# Patient Record
Sex: Male | Born: 1935 | Race: White | Hispanic: No | Marital: Married | Smoking: Former smoker
Health system: Southern US, Community
[De-identification: ages and names within clinical notes are randomized; demographics above are authoritative.]

## PROBLEM LIST (undated history)

## (undated) DIAGNOSIS — K529 Noninfective gastroenteritis and colitis, unspecified: Secondary | ICD-10-CM

## (undated) DIAGNOSIS — N210 Calculus in bladder: Secondary | ICD-10-CM

## (undated) DIAGNOSIS — D500 Iron deficiency anemia secondary to blood loss (chronic): Secondary | ICD-10-CM

## (undated) DIAGNOSIS — T4145XA Adverse effect of unspecified anesthetic, initial encounter: Secondary | ICD-10-CM

## (undated) DIAGNOSIS — K5000 Crohn's disease of small intestine without complications: Secondary | ICD-10-CM

## (undated) DIAGNOSIS — N400 Enlarged prostate without lower urinary tract symptoms: Secondary | ICD-10-CM

## (undated) DIAGNOSIS — N529 Male erectile dysfunction, unspecified: Secondary | ICD-10-CM

## (undated) DIAGNOSIS — N5201 Erectile dysfunction due to arterial insufficiency: Secondary | ICD-10-CM

## (undated) DIAGNOSIS — D333 Benign neoplasm of cranial nerves: Secondary | ICD-10-CM

## (undated) DIAGNOSIS — M1990 Unspecified osteoarthritis, unspecified site: Secondary | ICD-10-CM

## (undated) DIAGNOSIS — Z86718 Personal history of other venous thrombosis and embolism: Secondary | ICD-10-CM

## (undated) DIAGNOSIS — Q430 Meckel's diverticulum (displaced) (hypertrophic): Secondary | ICD-10-CM

## (undated) DIAGNOSIS — T8859XA Other complications of anesthesia, initial encounter: Secondary | ICD-10-CM

## (undated) DIAGNOSIS — Z8673 Personal history of transient ischemic attack (TIA), and cerebral infarction without residual deficits: Secondary | ICD-10-CM

## (undated) DIAGNOSIS — R972 Elevated prostate specific antigen [PSA]: Secondary | ICD-10-CM

## (undated) DIAGNOSIS — D3610 Benign neoplasm of peripheral nerves and autonomic nervous system, unspecified: Secondary | ICD-10-CM

## (undated) DIAGNOSIS — Z87448 Personal history of other diseases of urinary system: Secondary | ICD-10-CM

## (undated) DIAGNOSIS — M4800 Spinal stenosis, site unspecified: Secondary | ICD-10-CM

## (undated) DIAGNOSIS — E7800 Pure hypercholesterolemia, unspecified: Secondary | ICD-10-CM

## (undated) DIAGNOSIS — E291 Testicular hypofunction: Secondary | ICD-10-CM

## (undated) DIAGNOSIS — Z87898 Personal history of other specified conditions: Secondary | ICD-10-CM

## (undated) DIAGNOSIS — M797 Fibromyalgia: Secondary | ICD-10-CM

## (undated) DIAGNOSIS — R82992 Hyperoxaluria: Secondary | ICD-10-CM

## (undated) HISTORY — DX: Erectile dysfunction due to arterial insufficiency: N52.01

## (undated) HISTORY — DX: Iron deficiency anemia secondary to blood loss (chronic): D50.0

## (undated) HISTORY — DX: Testicular hypofunction: E29.1

## (undated) HISTORY — DX: Spinal stenosis, site unspecified: M48.00

## (undated) HISTORY — PX: TONSILLECTOMY AND ADENOIDECTOMY: SUR1326

## (undated) HISTORY — DX: Hyperoxaluria: R82.992

## (undated) HISTORY — PX: OTHER SURGICAL HISTORY: SHX169

## (undated) HISTORY — DX: Fibromyalgia: M79.7

## (undated) HISTORY — DX: Meckel's diverticulum (displaced) (hypertrophic): Q43.0

## (undated) HISTORY — DX: Pure hypercholesterolemia, unspecified: E78.00

---

## 2003-12-18 ENCOUNTER — Inpatient Hospital Stay (HOSPITAL_COMMUNITY): Admission: EM | Admit: 2003-12-18 | Discharge: 2003-12-20 | Payer: Self-pay | Admitting: Psychiatry

## 2004-05-02 ENCOUNTER — Inpatient Hospital Stay (HOSPITAL_COMMUNITY): Admission: EM | Admit: 2004-05-02 | Discharge: 2004-05-10 | Payer: Self-pay | Admitting: Emergency Medicine

## 2004-05-10 ENCOUNTER — Inpatient Hospital Stay (HOSPITAL_COMMUNITY)
Admission: RE | Admit: 2004-05-10 | Discharge: 2004-05-13 | Payer: Self-pay | Admitting: Physical Medicine & Rehabilitation

## 2004-05-11 HISTORY — PX: HIP FRACTURE SURGERY: SHX118

## 2004-05-13 ENCOUNTER — Inpatient Hospital Stay (HOSPITAL_COMMUNITY): Admission: AD | Admit: 2004-05-13 | Discharge: 2004-05-20 | Payer: Self-pay | Admitting: Internal Medicine

## 2004-05-20 ENCOUNTER — Inpatient Hospital Stay
Admission: RE | Admit: 2004-05-20 | Discharge: 2004-05-27 | Payer: Self-pay | Admitting: Physical Medicine & Rehabilitation

## 2004-11-28 ENCOUNTER — Inpatient Hospital Stay (HOSPITAL_COMMUNITY): Admission: EM | Admit: 2004-11-28 | Discharge: 2004-12-07 | Payer: Self-pay | Admitting: Emergency Medicine

## 2004-11-28 ENCOUNTER — Ambulatory Visit: Payer: Self-pay | Admitting: Physical Medicine & Rehabilitation

## 2004-12-07 ENCOUNTER — Inpatient Hospital Stay (HOSPITAL_COMMUNITY)
Admission: RE | Admit: 2004-12-07 | Discharge: 2004-12-27 | Payer: Self-pay | Admitting: Physical Medicine & Rehabilitation

## 2005-01-02 ENCOUNTER — Ambulatory Visit: Payer: Self-pay | Admitting: Psychiatry

## 2005-01-02 ENCOUNTER — Other Ambulatory Visit (HOSPITAL_COMMUNITY): Admission: RE | Admit: 2005-01-02 | Discharge: 2005-01-03 | Payer: Self-pay | Admitting: Psychiatry

## 2005-01-03 ENCOUNTER — Encounter
Admission: RE | Admit: 2005-01-03 | Discharge: 2005-04-03 | Payer: Self-pay | Admitting: Physical Medicine & Rehabilitation

## 2005-01-19 ENCOUNTER — Encounter
Admission: RE | Admit: 2005-01-19 | Discharge: 2005-04-19 | Payer: Self-pay | Admitting: Physical Medicine & Rehabilitation

## 2005-01-31 ENCOUNTER — Ambulatory Visit: Payer: Self-pay | Admitting: Physical Medicine & Rehabilitation

## 2005-03-24 ENCOUNTER — Ambulatory Visit: Payer: Self-pay | Admitting: Gastroenterology

## 2005-03-26 ENCOUNTER — Inpatient Hospital Stay (HOSPITAL_COMMUNITY): Admission: EM | Admit: 2005-03-26 | Discharge: 2005-03-30 | Payer: Self-pay | Admitting: Emergency Medicine

## 2005-03-27 ENCOUNTER — Ambulatory Visit: Payer: Self-pay | Admitting: Gastroenterology

## 2005-04-22 ENCOUNTER — Ambulatory Visit (HOSPITAL_COMMUNITY): Admission: RE | Admit: 2005-04-22 | Discharge: 2005-04-22 | Payer: Self-pay | Admitting: Neurology

## 2005-05-02 ENCOUNTER — Ambulatory Visit: Payer: Self-pay | Admitting: Gastroenterology

## 2005-05-25 ENCOUNTER — Ambulatory Visit: Payer: Self-pay | Admitting: Gastroenterology

## 2005-11-14 ENCOUNTER — Ambulatory Visit (HOSPITAL_COMMUNITY): Admission: RE | Admit: 2005-11-14 | Discharge: 2005-11-14 | Payer: Self-pay | Admitting: Neurology

## 2005-12-05 ENCOUNTER — Ambulatory Visit (HOSPITAL_COMMUNITY): Admission: RE | Admit: 2005-12-05 | Discharge: 2005-12-06 | Payer: Self-pay | Admitting: Urology

## 2006-03-02 ENCOUNTER — Ambulatory Visit (HOSPITAL_COMMUNITY): Admission: RE | Admit: 2006-03-02 | Discharge: 2006-03-02 | Payer: Self-pay | Admitting: Neurology

## 2006-10-29 ENCOUNTER — Encounter: Admission: RE | Admit: 2006-10-29 | Discharge: 2006-10-29 | Payer: Self-pay | Admitting: Neurology

## 2006-11-11 IMAGING — CR DG SMALL BOWEL
3 series · 3 of 3 positions shown · non-contrast
Comparison: none

CLINICAL DATA: Crohn's disease.  Resolving small bowel obstruction.
 SMALL BOWEL FOLLOW-THROUGH   - 03/28/05 AT 2712 HOURS:

[view not recorded (1 of 3)]
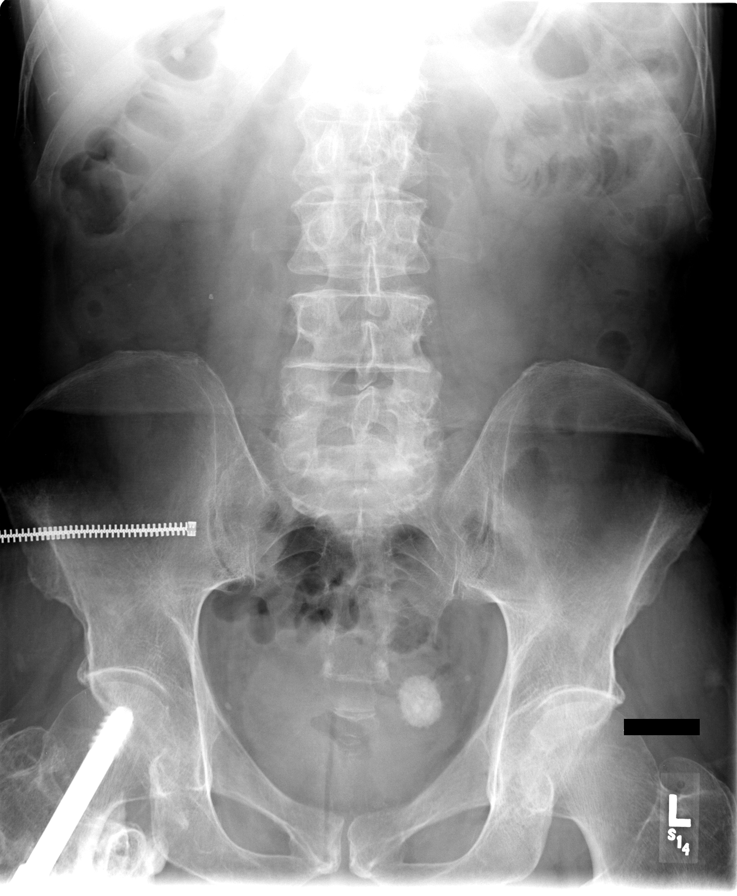

[view not recorded (2 of 3)]
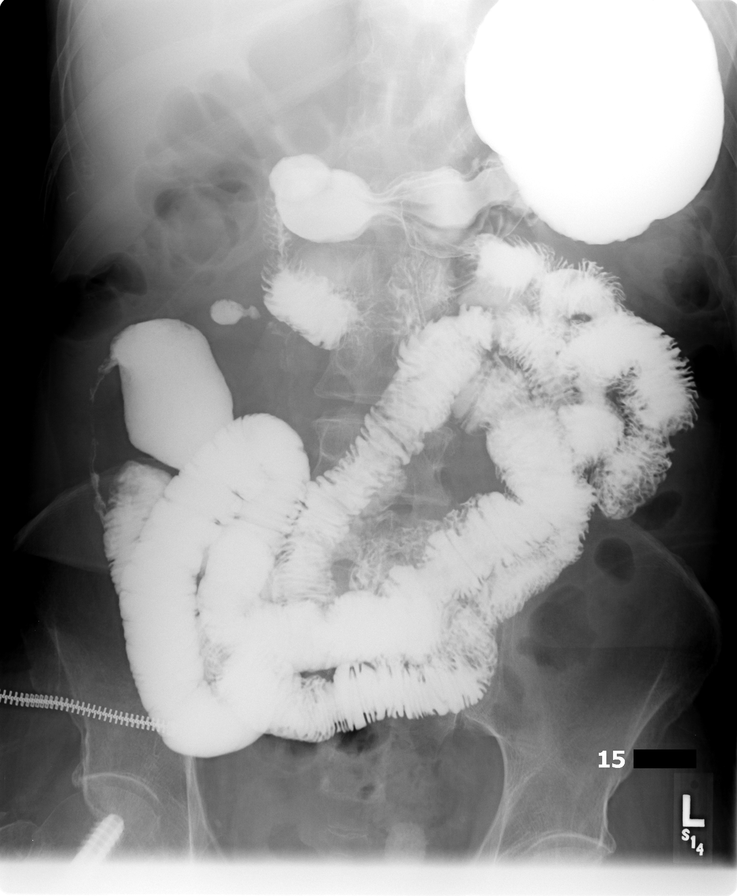

[view not recorded (3 of 3)]
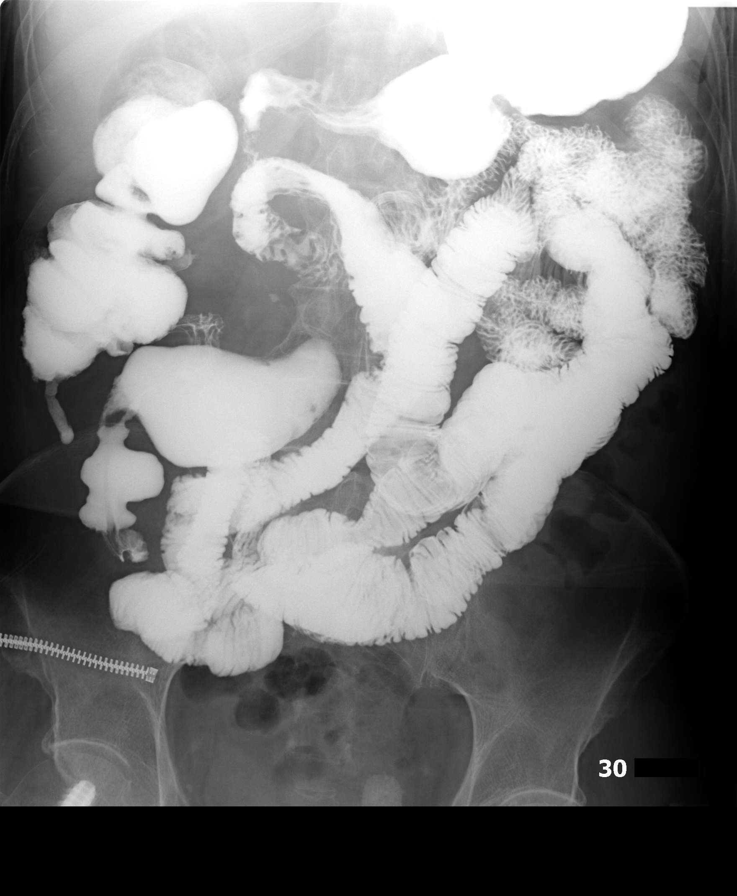

[3 of 3 positions shown; findings below may reference images not displayed]

FINDINGS: The scout KUB demonstrates a nonspecific mildly prominent small bowel loop in the left upper quadrant and a large calculus over the pelvis, likely a bladder calculus.
 Transit of barium through the small bowel is rapid as contrast reaches the colon at 30 minutes.  Imaging of the small bowel demonstrates two segments of diseased small bowel in the distal ileum.  The areas demonstrate severe narrowing characteristic of a ?string sign? over a length of 5-10 cm.  This occurs in two separate segments separated by a 10 cm length of more normal appearing ileum.  In addition, in the right lower quadrant involving a segment of ileum more proximal than the two mentioned diseased segments, there is a focal outpouching of contrast which fills a small cavity.  This does not communicate with the terminal ileum and either represents a diverticulum or focal extravasation of contrast which has walled off.  There is no disproportionate dilatation of small bowel to suggest obstruction.  The appendix is within normal limits.
IMPRESSION: 1.  Rapid transit of contrast to the colon without disproportionate small bowel dilatation.
 2.  Two areas of severe narrowing in the terminal ileum as described compatible with a ?string sign? as seen with Crohn's disease.
 3.  Focal outpouching of contrast from a segment of proximal ileum, either a diverticulum or contained bowel extravasation.  CT can be performed to further delineate pathology.

## 2015-10-18 ENCOUNTER — Other Ambulatory Visit: Payer: Self-pay

## 2015-10-18 ENCOUNTER — Telehealth: Payer: Self-pay | Admitting: Gastroenterology

## 2015-10-18 NOTE — Telephone Encounter (Signed)
Former patient of Dr Sharlett Iles who has not had problems with his Crohn's until recently. He had a sudden onset of abdominal pain and vomiting. May have had some fever.He went to [facility] where he was given IM Phenergan and prescription for oral prednisone. He was also told to follow up. He has some improvement. Scheduled for an appointment.

## 2015-10-20 ENCOUNTER — Encounter: Payer: Self-pay | Admitting: Physician Assistant

## 2015-10-20 ENCOUNTER — Other Ambulatory Visit (INDEPENDENT_AMBULATORY_CARE_PROVIDER_SITE_OTHER): Payer: Medicare Other

## 2015-10-20 ENCOUNTER — Ambulatory Visit (INDEPENDENT_AMBULATORY_CARE_PROVIDER_SITE_OTHER): Payer: Medicare Other | Admitting: Physician Assistant

## 2015-10-20 ENCOUNTER — Telehealth: Payer: Self-pay | Admitting: Physician Assistant

## 2015-10-20 VITALS — BP 130/70 | HR 104 | Ht 71.0 in | Wt 166.4 lb

## 2015-10-20 DIAGNOSIS — R109 Unspecified abdominal pain: Secondary | ICD-10-CM

## 2015-10-20 DIAGNOSIS — Q430 Meckel's diverticulum (displaced) (hypertrophic): Secondary | ICD-10-CM | POA: Insufficient documentation

## 2015-10-20 DIAGNOSIS — E559 Vitamin D deficiency, unspecified: Secondary | ICD-10-CM

## 2015-10-20 DIAGNOSIS — R197 Diarrhea, unspecified: Secondary | ICD-10-CM

## 2015-10-20 DIAGNOSIS — Z8719 Personal history of other diseases of the digestive system: Secondary | ICD-10-CM

## 2015-10-20 DIAGNOSIS — R112 Nausea with vomiting, unspecified: Secondary | ICD-10-CM

## 2015-10-20 LAB — COMPREHENSIVE METABOLIC PANEL
ALT: 21 U/L (ref 0–53)
AST: 13 U/L (ref 0–37)
Albumin: 3.9 g/dL (ref 3.5–5.2)
Alkaline Phosphatase: 57 U/L (ref 39–117)
BUN: 18 mg/dL (ref 6–23)
CHLORIDE: 105 meq/L (ref 96–112)
CO2: 27 mEq/L (ref 19–32)
Calcium: 10.3 mg/dL (ref 8.4–10.5)
Creatinine, Ser: 1 mg/dL (ref 0.40–1.50)
GFR: 76.79 mL/min (ref >60.00)
GLUCOSE: 105 mg/dL — AB (ref 70–99)
POTASSIUM: 4 meq/L (ref 3.5–5.1)
SODIUM: 141 meq/L (ref 135–145)
Total Bilirubin: 0.5 mg/dL (ref 0.2–1.2)
Total Protein: 7.1 g/dL (ref 6.0–8.3)

## 2015-10-20 LAB — LIPASE: Lipase: 68 U/L — ABNORMAL HIGH (ref 11.0–59.0)

## 2015-10-20 LAB — CBC WITH DIFFERENTIAL/PLATELET
Basophils Absolute: 0 10*3/uL (ref 0.0–0.1)
Basophils Relative: 0.2 % (ref 0.0–3.0)
EOS ABS: 0.1 10*3/uL (ref 0.0–0.7)
EOS PCT: 0.6 % (ref 0.0–5.0)
HCT: 37.7 % — ABNORMAL LOW (ref 39.0–52.0)
Hemoglobin: 12.5 g/dL — ABNORMAL LOW (ref 13.0–17.0)
LYMPHS ABS: 1.2 10*3/uL (ref 0.7–4.0)
Lymphocytes Relative: 11.3 % — ABNORMAL LOW (ref 12.0–46.0)
MCHC: 33.1 g/dL (ref 30.0–36.0)
MCV: 92.1 fl (ref 78.0–100.0)
MONO ABS: 0.7 10*3/uL (ref 0.1–1.0)
Monocytes Relative: 6.1 % (ref 3.0–12.0)
NEUTROS PCT: 81.8 % — AB (ref 43.0–77.0)
Neutro Abs: 9 10*3/uL — ABNORMAL HIGH (ref 1.4–7.7)
Platelets: 430 10*3/uL — ABNORMAL HIGH (ref 150.0–400.0)
RBC: 4.09 Mil/uL — AB (ref 4.22–5.81)
RDW: 13.7 % (ref 11.5–15.5)
WBC: 11 10*3/uL — ABNORMAL HIGH (ref 4.0–10.5)

## 2015-10-20 LAB — CALCIUM: Calcium: 10.4 mg/dL (ref 8.4–10.5)

## 2015-10-20 LAB — VITAMIN D 25 HYDROXY (VIT D DEFICIENCY, FRACTURES): VITD: 29.68 ng/mL — ABNORMAL LOW (ref 30.00–100.00)

## 2015-10-20 LAB — AMYLASE: Amylase: 52 U/L (ref 27–131)

## 2015-10-20 LAB — C-REACTIVE PROTEIN: CRP: 0.4 mg/dL — ABNORMAL LOW (ref 0.5–20.0)

## 2015-10-20 NOTE — Progress Notes (Signed)
Patient ID: Raymond Rubio, male   DOB: 1935/11/05, 80 y.o.   MRN: [id number]    HPI:  Raymond Rubio is a 80 y.o.   male  referred by Franchot Gallo, MD for evaluation of nausea, vomiting, and diarrhea. Raymond Rubio was previously a patient of Dr. Verl Blalock, but has not been seen in the office since 2006. Raymond Rubio reports that he was diagnosed with Crohn's disease by Dr. Coralie Common in [place], [place], in 1972. In the 1990s he had multiple ER visits and became Dr. Buel Ream patient. He states he was never on any long-term maintenance medications. He had numerous courses of prednisone and states before his last visit he was given numerous samples of Entocort to use on a when necessary basis. Since then, he has managed his "flares" with Entocort that he had left at home. He states that for a period of time in the early 2000 he was on long-term tetracycline and a probiotic. He has also been on Flagyl several times. He reports that when he has flares he gets frequent diarrhea. He states in the past he had a barium enema that was normal. He was hospitalized in 1972 with diarrhea and had an upper GI that showed a "string sign" and he was told he had Crohn's. He has never had an upper endoscopy or a colonoscopy.  Through the past years, he has had days of formed stools alternating with days of loose stools. He notes that ingestion of fatty foods causes immediate postprandial diarrhea. He often eats, gets crampy, and has to run to the bathroom. He has had no bright red blood per rectum or melena. About 10 or 11 days ago he began to feel nauseous and has vomited 5-10 times daily for several days. He was seen at the [facility] urgent care in [facility] and had blood work that was normal. No imaging was done. He states he was given 3 tablets of prednisone and told to follow up with GI. He continues to have waves of nausea are worse on an empty stomach. He has no vomiting. He states he feels like  he has to vomit but can't always bring anything up. He has been able to keep liquids down and small amounts of blood or cereal. His bowel movements have been liquidy 3-4 times per day. He has no mucus. He has not had nocturnal stooling. He has had no bright red blood per rectum or melena. His weight has been stable.  He also reports that he has been treated periodically in the past for bacterial overgrowth with Xifaxan and Flora Q by Dr. Sharlett Iles. He says he has had vitamin D deficiency and calcium deficiency from chronic steroid use but has not seen a primary care doctor in many years. 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 He was on warfarin and antiseizure medications per. Of time and the thrombosis resolves. 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 He also has a history of kidney stones and BPH. He had a right hip pinning in 2005. He denies a family history of colon cancer, colon polyps, or inflammatory bowel disease. He states his maternal grandfather had diverticular disease. He quit smoking in his early 19s and denies use of alcohol or intravenous drugs. He is requesting the name of the primary care provider as he would like to establish care with a primary care provider as well.   Past Medical History  Diagnosis  Date  . Crohn's disease ([place])   . Fibromyalgia   . Thrombosis, superior sagittal sinus   . Zenker's diverticulum     Past Surgical History  Procedure Laterality Date  . Hip fracture surgery Right     with pin  . Bladder stone surgery     Family History  Problem Relation Age of Onset  . Esophageal cancer Neg Hx   . Colon cancer Neg Hx   . Diverticulitis Brother   . Pancreatic cancer Neg Hx   . Stomach cancer Neg Hx   . Diabetes Neg Hx   . Kidney disease Neg Hx   . Liver disease Neg Hx    Social History  Substance Use Topics  . Smoking status: Former Research scientist (life sciences)  . Smokeless tobacco: None     Comment: stopped in his  72s  . Alcohol Use: No   Current Outpatient Prescriptions  Medication Sig Dispense Refill  . alfuzosin (UROXATRAL) 10 MG 24 hr tablet Take 10 mg by mouth daily with breakfast.    . aspirin 81 MG tablet Take 81 mg by mouth daily.    Marland Kitchen CALCIUM CARBONATE PO Take 1-2 capsules by mouth daily.    . Cholecalciferol (VITAMIN D3) 10000 UNITS TABS Take 1 tablet by mouth once a week.    . Cholecalciferol (VITAMIN D3) 5000 UNITS CAPS Take 1 capsule by mouth daily.    Marland Kitchen dutasteride (AVODART) 0.5 MG capsule Take 0.5 mg by mouth daily.    Marland Kitchen loperamide (IMODIUM) 2 MG capsule Take 1 mg by mouth as needed for diarrhea or loose stools.    . Multiple Vitamins-Minerals (CENTRUM ADULTS PO) Take by mouth.     No current facility-administered medications for this visit.   No Known Allergies   Review of Systems: Gen: Denies any fever, chills, sweats, anorexia, fatigue, weakness, malaise, weight loss, and sleep disorder CV: Denies chest pain, angina, palpitations, syncope, orthopnea, PND, peripheral edema, and claudication. Resp: Denies dyspnea at rest, dyspnea with exercise, cough, sputum, wheezing, coughing up blood, and pleurisy. GI: Denies vomiting blood, jaundice, and fecal incontinence.   Denies dysphagia or odynophagia. GU : Denies urinary burning, blood in urine, urinary frequency, urinary hesitancy, nocturnal urination, and urinary incontinence. MS: Denies joint pain, limitation of movement, and swelling, stiffness, low back pain, extremity pain. Denies muscle weakness, cramps, atrophy.  Derm: Denies rash, itching, dry skin, hives, moles, warts, or unhealing ulcers.  Psych: Denies depression, anxiety, memory loss, suicidal ideation, hallucinations, paranoia, and confusion. Heme: Denies bruising, bleeding, and enlarged lymph nodes. Neuro:  Denies any headaches, dizziness, paresthesias. Endo:  Denies any problems with DM, thyroid, adrenal function  Labs: Blood work from [facility] 10/11/2015 CBC  white count 10.2 hemoglobin 14.1, hematocrit 44.3, platelets 459,000, MCV 97. Sedimentation rate 29. Comprehensive metabolic panel total bili 1.4, alkaline phosphatase 64, AST 16, ALT 16. BUN 22, creatinine 1.3. Amylase 33, lipase 29.   Physical Exam: BP 130/70 mmHg  Pulse 104  Ht 5' 11"  (1.803 m)  Wt 166 lb 6.4 oz (75.479 kg)  BMI 23.22 kg/m2 Constitutional: Pleasant,well-developed, pale, Caucasian male in no acute distress. HEENT: Normocephalic and atraumatic. Conjunctivae are normal. No scleral icterus. Neck supple. No thyromegaly Cardiovascular: Normal rate, regular rhythm.  Pulmonary/chest: Effort normal and breath sounds normal. No wheezing, rales or rhonchi. Abdominal: Soft, nondistended, nontender. Bowel sounds active throughout. There are no masses palpable. No hepatomegaly. Extremities: no edema Lymphadenopathy: No cervical adenopathy noted. Neurological: Alert and oriented to person place and time. Skin: Skin  is warm and dry. No rashes noted. Psychiatric: Normal mood and affect. Behavior is normal.  ASSESSMENT AND PLAN: 80 year old male with a history of what sounds like chronic small bowel Crohn's, IBS, recurrent bacterial overgrowth syndrome, chronic calcium deficiency, and vitamin D deficiency, referred for evaluation. Patient has been experiencing nausea vomiting and diarrhea for the past 10 days. A CBC, comprehensive metabolic panel, amylase, lipase, CRP, calcium, and vitamin D will be obtained. A stool for C. difficile will also be obtained. He will be scheduled for a CT of the abdomen and pelvis evaluate for any disease activity, colitis, ileitis, etc. He will be given a trial of Zofran 8 mg 1 by mouth every 8 hours when necessary nausea. He will follow up in 2 weeks, sooner if needed. He will likely need to be scheduled for a colonoscopy for further evaluation of disease activity as well, pending the results of his CT scan. He has also been provided with the names of several  primary care providers in [place] primary care so that he can establish care with a primary care provider.    Nuha Degner, Vita Barley PA-C 10/20/2015, 3:24 PM  CC: Franchot Gallo, MD

## 2015-10-20 NOTE — Patient Instructions (Addendum)
You have been scheduled for a CT scan of the abdomen and pelvis at [facility] ([address]---this is in the same building as [facility]).   You are scheduled on 10/21/2015 at 1:30pm. You should arrive 15 minutes prior to your appointment time for registration. Please follow the written instructions below on the day of your exam:  WARNING: IF YOU ARE ALLERGIC TO IODINE/X-RAY DYE, PLEASE NOTIFY RADIOLOGY IMMEDIATELY AT [phone]! YOU WILL BE GIVEN A 13 HOUR PREMEDICATION PREP.  1) Do not eat or drink anything after 9:30am (4 hours prior to your test) 2) You have been given 2 bottles of oral contrast to drink. The solution may taste better if refrigerated, but do NOT add ice or any other liquid to this solution. Shake well before drinking.    Drink 1 bottle of contrast @ 11:30am (2 hours prior to your exam)  Drink 1 bottle of contrast @ 12:30pm (1 hour prior to your exam)  You may take any medications as prescribed with a small amount of water except for the following: Metformin, Glucophage, Glucovance, Avandamet, Riomet, Fortamet, Actoplus Met, Janumet, Glumetza or Metaglip. The above medications must be held the day of the exam AND 48 hours after the exam.  The purpose of you drinking the oral contrast is to aid in the visualization of your intestinal tract. The contrast solution may cause some diarrhea. Before your exam is started, you will be given a small amount of fluid to drink. Depending on your individual set of symptoms, you may also receive an intravenous injection of x-ray contrast/dye. Plan on being at [facility] for 30 minutes or long, depending on the type of exam you are having performed.  This test typically takes 30-45 minutes to complete.  If you have any questions regarding your exam or if you need to reschedule, you may call the CT department at [phone] between the hours of 8:00 am and 5:00 pm,  Monday-Friday.  ________________________________________________________________________   

## 2015-10-21 ENCOUNTER — Encounter (HOSPITAL_COMMUNITY): Payer: Self-pay

## 2015-10-21 ENCOUNTER — Ambulatory Visit (HOSPITAL_COMMUNITY)
Admission: RE | Admit: 2015-10-21 | Discharge: 2015-10-21 | Disposition: A | Discharge status: Home / Self Care | Payer: Medicare Other | Source: Ambulatory Visit | Attending: Physician Assistant | Admitting: Physician Assistant

## 2015-10-21 DIAGNOSIS — I70202 Unspecified atherosclerosis of native arteries of extremities, left leg: Secondary | ICD-10-CM | POA: Diagnosis not present

## 2015-10-21 DIAGNOSIS — R112 Nausea with vomiting, unspecified: Secondary | ICD-10-CM | POA: Insufficient documentation

## 2015-10-21 DIAGNOSIS — K5790 Diverticulosis of intestine, part unspecified, without perforation or abscess without bleeding: Secondary | ICD-10-CM | POA: Insufficient documentation

## 2015-10-21 DIAGNOSIS — N400 Enlarged prostate without lower urinary tract symptoms: Secondary | ICD-10-CM | POA: Insufficient documentation

## 2015-10-21 DIAGNOSIS — R197 Diarrhea, unspecified: Secondary | ICD-10-CM | POA: Diagnosis not present

## 2015-10-21 DIAGNOSIS — Z87891 Personal history of nicotine dependence: Secondary | ICD-10-CM | POA: Diagnosis not present

## 2015-10-21 DIAGNOSIS — N281 Cyst of kidney, acquired: Secondary | ICD-10-CM | POA: Diagnosis not present

## 2015-10-21 DIAGNOSIS — R109 Unspecified abdominal pain: Secondary | ICD-10-CM | POA: Insufficient documentation

## 2015-10-21 DIAGNOSIS — K5090 Crohn's disease, unspecified, without complications: Secondary | ICD-10-CM | POA: Insufficient documentation

## 2015-10-21 DIAGNOSIS — N210 Calculus in bladder: Secondary | ICD-10-CM | POA: Insufficient documentation

## 2015-10-21 DIAGNOSIS — Z8719 Personal history of other diseases of the digestive system: Secondary | ICD-10-CM

## 2015-10-21 DIAGNOSIS — K4020 Bilateral inguinal hernia, without obstruction or gangrene, not specified as recurrent: Secondary | ICD-10-CM | POA: Insufficient documentation

## 2015-10-21 DIAGNOSIS — I723 Aneurysm of iliac artery: Secondary | ICD-10-CM | POA: Insufficient documentation

## 2015-10-21 MED ORDER — IOHEXOL 300 MG/ML  SOLN
100.0000 mL | Freq: Once | INTRAMUSCULAR | Status: AC | PRN
  Administered 2015-10-21: 100 mL via INTRAVENOUS

## 2015-10-21 MED ORDER — ONDANSETRON HCL 8 MG PO TABS: 8.0000 mg | ORAL_TABLET | Freq: Three times a day (TID) | ORAL | Status: DC | PRN

## 2015-10-21 NOTE — Telephone Encounter (Signed)
Rx sent to pharmacy. Patient will try his best to drink contrast. He will bring in stool sample when he obtains it.

## 2015-10-21 NOTE — Telephone Encounter (Signed)
Patient calling back regarding this. He isn't sure if he can keep contrast down for Imaging today. He states that he didn't have anymore zofran to take before this procedure. Best # [phone]

## 2015-10-21 NOTE — Addendum Note (Signed)
Addended by: Hulan Saas on: 10/21/2015 09:22 AM   Modules accepted: Orders

## 2015-10-25 ENCOUNTER — Telehealth: Payer: Self-pay | Admitting: Physician Assistant

## 2015-10-25 NOTE — Telephone Encounter (Signed)
Waiting for reply from Dr Carlean Purl

## 2015-10-25 NOTE — Telephone Encounter (Signed)
Patient notified

## 2015-10-25 NOTE — Telephone Encounter (Signed)
Patient calling on CT results, Please advise

## 2015-10-27 ENCOUNTER — Telehealth: Payer: Self-pay | Admitting: Physician Assistant

## 2015-10-27 ENCOUNTER — Other Ambulatory Visit: Payer: Medicare Other

## 2015-10-27 ENCOUNTER — Other Ambulatory Visit: Payer: Self-pay | Admitting: *Deleted

## 2015-10-27 DIAGNOSIS — Z8719 Personal history of other diseases of the digestive system: Secondary | ICD-10-CM

## 2015-10-27 DIAGNOSIS — R109 Unspecified abdominal pain: Secondary | ICD-10-CM

## 2015-10-27 DIAGNOSIS — R197 Diarrhea, unspecified: Secondary | ICD-10-CM

## 2015-10-27 DIAGNOSIS — R112 Nausea with vomiting, unspecified: Secondary | ICD-10-CM

## 2015-10-27 NOTE — Progress Notes (Signed)
Quick Note:  Go ahead w/ quantiferon and hep B testing Send him the [place] sheet please - I can get it for you if you need help but if you Google "[facility] biologics PDF" it wil show up and can be printed He comes 11/9 to you and i am in clinic and we can talk then Not sure about colonoscopy - thinking ______

## 2015-10-27 NOTE — Telephone Encounter (Signed)
Left a message for patient to call back. 

## 2015-10-28 LAB — HEPATITIS B SURFACE ANTIGEN: Hepatitis B Surface Ag: NEGATIVE

## 2015-10-28 LAB — HEPATITIS B SURFACE ANTIBODY, QUANTITATIVE: Hepatitis B-Post: 0 m[IU]/mL

## 2015-10-28 LAB — HEPATITIS A ANTIBODY, TOTAL: Hep A Total Ab: NONREACTIVE

## 2015-10-28 LAB — HEPATITIS C ANTIBODY: HCV Ab: NEGATIVE

## 2015-10-28 NOTE — Telephone Encounter (Signed)
Left a message for patient to call back. 

## 2015-10-28 NOTE — Progress Notes (Signed)
Quick Note:  OK re does not want biologics Prednisone 10 mg tabs # 100 1 RF  Take 4 daily x 1 week, 3 daily x 2 weeks then 2 daily Stay at 2 See me in jan please - sooner prn ______

## 2015-10-29 ENCOUNTER — Other Ambulatory Visit: Payer: Self-pay | Admitting: *Deleted

## 2015-10-29 LAB — QUANTIFERON TB GOLD ASSAY (BLOOD)
Interferon Gamma Release Assay: NEGATIVE
MITOGEN VALUE: 5.7 [IU]/mL
QUANTIFERON NIL VALUE: 0.02 [IU]/mL
Quantiferon Tb Ag Minus Nil Value: 0.01 IU/mL
TB Ag value: 0.03 IU/mL

## 2015-10-29 MED ORDER — PREDNISONE 10 MG PO TABS: ORAL_TABLET | ORAL | Status: DC

## 2015-10-29 NOTE — Telephone Encounter (Signed)
Unable to reach patient at given number. Goes to another number.

## 2015-10-29 NOTE — Progress Notes (Signed)
Agree w/ Ms. Hvozdovic's note and mangement.

## 2015-10-29 NOTE — Telephone Encounter (Signed)
See results note. 

## 2015-11-03 ENCOUNTER — Other Ambulatory Visit (INDEPENDENT_AMBULATORY_CARE_PROVIDER_SITE_OTHER): Payer: Medicare Other

## 2015-11-03 ENCOUNTER — Ambulatory Visit (INDEPENDENT_AMBULATORY_CARE_PROVIDER_SITE_OTHER): Payer: Medicare Other | Admitting: Physician Assistant

## 2015-11-03 ENCOUNTER — Encounter: Payer: Self-pay | Admitting: Physician Assistant

## 2015-11-03 VITALS — BP 130/80 | HR 105 | Ht 71.0 in | Wt 168.8 lb

## 2015-11-03 DIAGNOSIS — K219 Gastro-esophageal reflux disease without esophagitis: Secondary | ICD-10-CM

## 2015-11-03 DIAGNOSIS — K50919 Crohn's disease, unspecified, with unspecified complications: Secondary | ICD-10-CM | POA: Diagnosis not present

## 2015-11-03 LAB — FOLATE

## 2015-11-03 LAB — IBC PANEL
IRON: 106 ug/dL (ref 42–165)
Saturation Ratios: 26 % (ref 20.0–50.0)
TRANSFERRIN: 291 mg/dL (ref 212.0–360.0)

## 2015-11-03 LAB — VITAMIN B12: Vitamin B-12: 326 pg/mL (ref 211–911)

## 2015-11-03 LAB — FERRITIN: Ferritin: 10.6 ng/mL — ABNORMAL LOW (ref 22.0–322.0)

## 2015-11-03 NOTE — Progress Notes (Signed)
Patient ID: Raymond Rubio, male   DOB: 12-Nov-1935, 80 y.o.   MRN: [id number]     History of Present Illness: Raymond Rubio is a 80 year old gentleman who was initially evaluated here on October 26. He was previously a patient of Dr. Verl Blalock. He was diagnosed with Crohn's disease in 1972. In the 1990s he became Dr. Buel Ream patient. He has never been on any long-term medications for his Crohn's but has had innumerable courses of prednisone. He has never had an upper endoscopy or colonoscopy. At his last visit he was sent for a CT scan which revealed bowel wall thickening of the distal ileum to the ileocecal valve consistent with a history of Crohn's disease. There was also additional questionable segment of bowel wall thickening of the small bowel loop in the right mid abdomen. He was prescribed prednisone but never filled the prescription because he states he feels fine and doesn't want to be on any medications. He had hepatitis serologies and a QuantiFERON TB cold drawn in anticipation of initiation of possible biologic therapy. He states he is not interested in being on any medication since she feels fine. He does however report that he is frequently nauseous on an empty stomach. His nausea is alleviated with ingestion of food. He has been belching or burping a lot. He has no nausea vomiting or dysphagia. He has not been using any medications for these symptoms.   Past Medical History  Diagnosis Date  . Crohn's disease ([place])   . Fibromyalgia   . Thrombosis, superior sagittal sinus   . Meckel's diverticulum     Past Surgical History  Procedure Laterality Date  . Hip fracture surgery Right     with pin  . Bladder stone surgery     Family History  Problem Relation Age of Onset  . Esophageal cancer Neg Hx   . Colon cancer Neg Hx   . Diverticulitis Brother   . Pancreatic cancer Neg Hx   . Stomach cancer Neg Hx   . Diabetes Neg Hx   . Kidney disease Neg Hx   . Liver  disease Neg Hx    Social History  Substance Use Topics  . Smoking status: Former Research scientist (life sciences)  . Smokeless tobacco: None     Comment: stopped in his 9s  . Alcohol Use: No   Current Outpatient Prescriptions  Medication Sig Dispense Refill  . alfuzosin (UROXATRAL) 10 MG 24 hr tablet Take 10 mg by mouth daily with breakfast.    . aspirin 81 MG tablet Take 81 mg by mouth daily.    Marland Kitchen CALCIUM CARBONATE PO Take 1-2 capsules by mouth daily.    . Cholecalciferol (VITAMIN D3) 10000 UNITS TABS Take 1 tablet by mouth once a week.    . Cholecalciferol (VITAMIN D3) 5000 UNITS CAPS Take 1 capsule by mouth daily.    Marland Kitchen dutasteride (AVODART) 0.5 MG capsule Take 0.5 mg by mouth daily.    Marland Kitchen loperamide (IMODIUM) 2 MG capsule Take 1 mg by mouth as needed for diarrhea or loose stools.    . Multiple Vitamins-Minerals (CENTRUM ADULTS PO) Take by mouth.    . ondansetron (ZOFRAN) 8 MG tablet Take 1 tablet (8 mg total) by mouth every 8 (eight) hours as needed for nausea or vomiting. 20 tablet 0  . predniSONE (DELTASONE) 10 MG tablet Take 40 mg daily x 1 week then 30 mg daily x 2 weeks then 20 mg daily until OV. 100 tablet 1   No current  facility-administered medications for this visit.   No Known Allergies   Review of Systems: Gen: Denies any fever, chills, sweats, anorexia, fatigue, weakness, malaise, weight loss, and sleep disorder CV: Denies chest pain, angina, palpitations, syncope, orthopnea, PND, peripheral edema, and claudication. Resp: Denies dyspnea at rest, dyspnea with exercise, cough, sputum, wheezing, coughing up blood, and pleurisy. GI: Denies vomiting blood, jaundice, and fecal incontinence.   Denies dysphagia or odynophagia. GU : Denies urinary burning, blood in urine, urinary frequency, urinary hesitancy, nocturnal urination, and urinary incontinence. MS: Denies joint pain, limitation of movement, and swelling, stiffness, low back pain, extremity pain. Denies muscle weakness, cramps, atrophy.    Derm: Denies rash, itching, dry skin, hives, moles, warts, or unhealing ulcers.  Psych: Denies depression, anxiety, memory loss, suicidal ideation, hallucinations, paranoia, and confusion. Heme: Denies bruising, bleeding, and enlarged lymph nodes. Neuro:  Denies any headaches, dizziness, paresthesia Endo:  Denies any problems with DM, thyroid, adrenal  LAB RESULTS: CBC 10/20/2015 WBC 11, hemoglobin 12.5, hematocrit 37.7, platelets 430,000, MCV 92.1. QuantiFERON TB cold negative Hepatitis A total antibody nonreactive hepatitis B surface antigen negative hepatitis B surface antibody negative hepatitis C antibody negative Studies:   Ct Abdomen Pelvis W Contrast  10/21/2015  CLINICAL DATA:  Diarrhea and abdominal pain for 10 days, nausea and vomiting, Crohn's disease, former smoker EXAM: CT ABDOMEN AND PELVIS WITH CONTRAST TECHNIQUE: Multidetector CT imaging of the abdomen and pelvis was performed using the standard protocol following bolus administration of intravenous contrast. Sagittal and coronal MPR images reconstructed from axial data set. CONTRAST:  147m OMNIPAQUE IOHEXOL 300 MG/ML SOLN IV. Dilute oral contrast. COMPARISON:  None FINDINGS: Lung bases clear. Minimal fatty infiltration of liver. Tiny LEFT renal cyst. Liver, gallbladder, spleen, pancreas, kidneys, and adrenal glands normal. 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 Bowel wall thickening of distal ileum to ileocecal valve consistent with history of Crohn's disease. No evidence of bowel obstruction. Additional questionable segment of bowel wall thickening of a small bowel loop in the RIGHT mid abdomen. Normal appendix. Diverticulosis of distal transverse through sigmoid colon without evidence of diverticulitis. Stomach and bowel loops otherwise normal appearance. 11 mm calculus within urinary bladder. Significant prostatic enlargement 5.8 x 5.5 x 5.5 cm.  BILATERAL inguinal hernias containing fat. No mass, adenopathy, free fluid or free air. Osseous demineralization with scattered degenerative disc disease changes lumbar spine. Posttraumatic deformity of RIGHT femur with evidence of prior nailing/ORIF. IMPRESSION: Distal ileal bowel wall thickening consistent with history of Crohn's disease. Question additional segmental wall thickening in anterior RIGHT mid abdomen. No evidence of perforation or abscess. Marked prostatic enlargement. 11 mm bladder calculus. BILATERAL inguinal hernias containing fat. 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 These results will be called to the ordering clinician or representative by the Radiologist Assistant, and communication documented in the PACS or zVision Dashboard. Electronically Signed   By: MLavonia DanaM.D.   On: 10/21/2015 14:36     Physical Exam: BP 130/80 mmHg  Pulse 105  Ht 5' 11"  (1.803 m)  Wt 168 lb 12.8 oz (76.567 kg)  BMI 23.55 kg/m2 General: Pleasant, well developed , male in no acute distress Head: Normocephalic and atraumatic Eyes:  sclerae anicteric, conjunctiva pink  Ears: Normal auditory acuity Lungs: Clear throughout to auscultation Heart: Regular rate and rhythm Abdomen: Soft, non distended, non-tender. No masses, no hepatomegaly. Normal bowel sounds Musculoskeletal: Symmetrical with no gross deformities  Extremities: No  edema  Neurological: Alert oriented x 4, grossly nonfocal Psychological:  Alert and cooperative. Normal mood and affect  Assessment and Recommendations:  80 year old male with a history of chronic small bowel Crohn's and IBS status post recent CT consistent with Crohn's, here for follow-up. Patient is currently asymptomatic and does not wish to have any medications for his Crohn's. He requests that iron studies and a B-12 be drawn despite his normal CBC. With regards to his nausea and belching, it was explained to him that this may be due to  some GERD. He does not want to take PPI due to risks of exacerbating his osteopenia. He will use Pepcid 20 mg twice a day. He will return in January at which time he would like to establish care with Dr. Carlean Purl.       Jalayia Bagheri, Vita Barley PA-C 11/03/2015,

## 2015-11-03 NOTE — Patient Instructions (Signed)
Use you Pepcid 20 mg twice a day as needed.   Follow up with Dr. Carlean Purl  Your physician has requested that you go to the basement for lab work before leaving today.

## 2015-11-04 ENCOUNTER — Other Ambulatory Visit: Payer: Self-pay | Admitting: *Deleted

## 2015-11-04 DIAGNOSIS — R790 Abnormal level of blood mineral: Secondary | ICD-10-CM

## 2015-11-04 MED ORDER — FERROUS GLUCONATE 324 (38 FE) MG PO TABS: 324.0000 mg | ORAL_TABLET | Freq: Every day | ORAL | Status: DC

## 2015-11-20 NOTE — Progress Notes (Signed)
Agree w/ Ms. Hvozdovic's note and mangement.

## 2015-12-31 ENCOUNTER — Encounter: Payer: Self-pay | Admitting: Internal Medicine

## 2015-12-31 ENCOUNTER — Ambulatory Visit (INDEPENDENT_AMBULATORY_CARE_PROVIDER_SITE_OTHER): Payer: Medicare Other | Admitting: Internal Medicine

## 2015-12-31 VITALS — BP 108/78 | HR 60 | Ht 71.0 in | Wt 169.0 lb

## 2015-12-31 DIAGNOSIS — D509 Iron deficiency anemia, unspecified: Secondary | ICD-10-CM

## 2015-12-31 DIAGNOSIS — D500 Iron deficiency anemia secondary to blood loss (chronic): Secondary | ICD-10-CM | POA: Insufficient documentation

## 2015-12-31 DIAGNOSIS — K50019 Crohn's disease of small intestine with unspecified complications: Secondary | ICD-10-CM

## 2015-12-31 DIAGNOSIS — K5000 Crohn's disease of small intestine without complications: Secondary | ICD-10-CM | POA: Insufficient documentation

## 2015-12-31 HISTORY — DX: Iron deficiency anemia secondary to blood loss (chronic): D50.0

## 2015-12-31 MED ORDER — BUDESONIDE 3 MG PO CPEP: 9.0000 mg | ORAL_CAPSULE | ORAL | Status: DC

## 2015-12-31 MED ORDER — FERROUS SULFATE 324 (65 FE) MG PO TBEC: 324.0000 mg | DELAYED_RELEASE_TABLET | Freq: Two times a day (BID) | ORAL | Status: DC

## 2015-12-31 NOTE — Patient Instructions (Signed)
   We have sent the following medications to your pharmacy for you to pick up at your convenience: Ferrous Sulfate and Entocort to have on hand for flares.   Please come in early March and get a CBC drawn.  No appointment needed, the lab is open 7:30AM-5:30PM, closed for lunch 1:30pm-2:00pm.   Today we are giving you information to read on immunomodulators and biologics.   I appreciate the opportunity to care for you. Silvano Rusk, MD, [facility]

## 2015-12-31 NOTE — Progress Notes (Signed)
Subjective:    Patient ID: Raymond Rubio, male    DOB: [birth year], 81 y.o.   MRN: [id number] Chief complaint: Follow-up Crohn's disease HPI Patient is an elderly white man with a long history of Crohn's disease of the small bowel. He was seen by one of our physician assistant's in November and refer to that note for other details. It does not sound like he's ever really taking chronic therapy he tells me was initially treated with prednisone many years ago in the 1970s, and then Azulfidine had to be stopped because of some anemia or pancytopenia. Sounds like he used to off and on, more recently in the 1990s he had seen Dr. Sharlett Iles at some point was given some budesonide samples her prescription and would take that intermittently when he had what he called flares. More recently he had a flare in the fall and was treated with 3 days of 50 mg prednisone. He described nausea vomiting and diarrhea and abdominal pain. Now he has been evaluated with a CT scan at our request which showed changes in the distal ileum up to the ileocecal valve and another segment of probable ileum with inflammatory changes as well. Colon looks normal. He describes intermittent normal and watery stools. No nausea or vomiting or significant abdominal pain. We had suggested that he try Humira biologic therapy but he has decided he is not interested in that. He was treated with ferrous gluconate but he felt like it made his diarrhea worse though he admits it could've this been the Crohn's disease rather than the medication doing that. He is anemic and iron deficient.  Medications, allergies, past medical history, past surgical history, family history and social history are reviewed and updated in the EMR.    Review of Systems As above, suffers from chronic depression, resistant to multiple therapies she says.    Objective:   Physical Exam @BP  108/78 mmHg  Pulse 60  Ht 5' 11"  (1.803 m)  Wt 169 lb (76.658 kg)  BMI 23.58  kg/m2@  General:  NAD Eyes:   anicteric Lungs:  clear Heart:: S1S2 no rubs, murmurs or gallops Abdomen:  soft and nontender, BS+ Ext:   no edema, cyanosis or clubbing    Data Reviewed:   As per history of present illness have reviewed our November note labs in the EMR CT abdomen and pelvis scan including images was reviewed with the patient today.      Assessment & Plan:   1. Crohn's disease of small intestine with complication ([place])   2. Anemia, iron deficiency    I reviewed all of possible short term and chronic treatments that I could think of with this patient. He is reluctant to take Biologics. He prefers to take intermittent budesonide therapy to control what he calls flares. It sounds like he's done that over the years taking it for a few days at a time or we can a time perhaps. Humira would be cost prohibitive for him we think. He had not heard of immunomodulators. I don't think a mesalamine or sulfasalazine-like compound is worth it in his case given his small bowel disease. He seems to have relatively mild problems over time. He reports to using Imodium as needed with some relief. I asked him if he thought his quality of life is reasonable at this time and that was a little difficult to sort that answer out but it sounds like he prefers taking the intermittent budesonide, and avoiding any risk year therapy  or any chronic daily therapy. We discussed a colonoscopy to further understand his situation, he is not inclined to do that. I explained to him that his chronic inflammation puts him at risk of things like blood clots/DVT, perhaps even cardiac events even the increased inflammation in his body and that at least in that sense chronic therapy with something like Humira could improve things.  So, I prescribed budesonide 9 mg to be taken as needed. He will continue use Imodium. We will try ferrous sulfate and so the ferrous gluconate to see if he can take that without GI upset to  treat his iron deficiency anemia. He will then see me in 6 months routinely sooner as needed. Understands that he is at risk of worsening problems by not taking a chronic immunosuppressive but does not prefer to do that. It seems like he has done reasonably well over the years.  I did give him [facility] [employer] [facility] [employer] literature about immunomodulators and biologic therapy.  I appreciate the opportunity to care for this patient. I will send a copy to Dr. Preston Fleeting

## 2016-08-16 ENCOUNTER — Encounter: Payer: Self-pay | Admitting: Internal Medicine

## 2016-08-16 ENCOUNTER — Other Ambulatory Visit (INDEPENDENT_AMBULATORY_CARE_PROVIDER_SITE_OTHER): Payer: Medicare Other

## 2016-08-16 ENCOUNTER — Ambulatory Visit (INDEPENDENT_AMBULATORY_CARE_PROVIDER_SITE_OTHER): Payer: Medicare Other | Admitting: Internal Medicine

## 2016-08-16 DIAGNOSIS — K50018 Crohn's disease of small intestine with other complication: Secondary | ICD-10-CM | POA: Diagnosis not present

## 2016-08-16 DIAGNOSIS — D500 Iron deficiency anemia secondary to blood loss (chronic): Secondary | ICD-10-CM | POA: Diagnosis not present

## 2016-08-16 LAB — CBC WITH DIFFERENTIAL/PLATELET
BASOS ABS: 0 10*3/uL (ref 0.0–0.1)
Basophils Relative: 0.3 % (ref 0.0–3.0)
EOS ABS: 0.1 10*3/uL (ref 0.0–0.7)
Eosinophils Relative: 1.1 % (ref 0.0–5.0)
HCT: 35.8 % — ABNORMAL LOW (ref 39.0–52.0)
HEMOGLOBIN: 12.2 g/dL — AB (ref 13.0–17.0)
Lymphocytes Relative: 15.7 % (ref 12.0–46.0)
Lymphs Abs: 1.3 10*3/uL (ref 0.7–4.0)
MCHC: 33.9 g/dL (ref 30.0–36.0)
MCV: 93.2 fl (ref 78.0–100.0)
MONO ABS: 0.7 10*3/uL (ref 0.1–1.0)
Monocytes Relative: 8.8 % (ref 3.0–12.0)
Neutro Abs: 6.2 10*3/uL (ref 1.4–7.7)
Neutrophils Relative %: 74.1 % (ref 43.0–77.0)
Platelets: 389 10*3/uL (ref 150.0–400.0)
RBC: 3.85 Mil/uL — AB (ref 4.22–5.81)
RDW: 13.8 % (ref 11.5–15.5)
WBC: 8.4 10*3/uL (ref 4.0–10.5)

## 2016-08-16 LAB — FERRITIN: FERRITIN: 9 ng/mL — AB (ref 22.0–322.0)

## 2016-08-16 NOTE — Progress Notes (Signed)
Mild anemia still and iron still low He is intolerant of oral iron  Plan is feraheme x 2 injections 2 weeks apart and ferritin and CBC 1 month after second injection

## 2016-08-16 NOTE — Assessment & Plan Note (Signed)
Seems to be flaring He will start budesonide at 9 mg daily

## 2016-08-16 NOTE — Assessment & Plan Note (Signed)
CBC ferritin today ? feraheme

## 2016-08-16 NOTE — Patient Instructions (Signed)
   Your physician has requested that you go to the basement for the following lab work before leaving today: CBC/diff, Ferritin    Start the budesonide that you have.    Follow up with Dr Carlean Purl in 2 months.      I appreciate the opportunity to care for you. Silvano Rusk, MD, [facility]

## 2016-08-16 NOTE — Progress Notes (Signed)
   Subjective:    Patient ID: Raymond Rubio, male    DOB: 07-01-1935, 81 y.o.   MRN: [id number] Cc: diarrhea, Crohn's f/u HPI Intermittent diarrhea - worse over time Requiring greater doses of loperamide 1 mg to 43m  Not on any Crohn's specific Tx  Medications, allergies, past medical history, past surgical history, family history and social history are reviewed and updated in the EMR.  Review of Systems Bladder stone growing - may need lithotrpsy    Objective:   Physical Exam @BP  110/70 (BP Location: Left Arm, Patient Position: Sitting, Cuff Size: Normal)   Pulse 92   Ht 5' 8"  (1.727 m) Comment: height measured without shoes  Wt 170 lb 2 oz (77.2 kg)   BMI 25.87 kg/m @  General:  NAD Eyes:   anicteric Lungs:  clear Heart::  S1S2 no rubs, murmurs or gallops Abdomen:  soft and nontender, BS+, sl fullness in RLQ   Data Reviewed:  Prior labs and GI notes    Assessment & Plan:  Crohn's disease of small intestine ([facility]) Seems to be flaring He will start budesonide at 9 mg daily  Iron deficiency anemia due to chronic blood loss CBC ferritin today ? feraheme  See me in 2 months

## 2016-08-18 ENCOUNTER — Telehealth: Payer: Self-pay | Admitting: Internal Medicine

## 2016-08-18 DIAGNOSIS — D509 Iron deficiency anemia, unspecified: Secondary | ICD-10-CM

## 2016-08-18 NOTE — Telephone Encounter (Signed)
Left message on machine to call back  

## 2016-08-18 NOTE — Telephone Encounter (Signed)
Gatha Mayer, MD  Marlon Pel, RN        Mild anemia still and iron still low  He is intolerant of oral iron   Plan is feraheme x 2 injections 2 weeks apart and ferritin and CBC 1 month after second injection

## 2016-08-20 ENCOUNTER — Encounter: Payer: Self-pay | Admitting: Internal Medicine

## 2016-08-21 ENCOUNTER — Other Ambulatory Visit: Payer: Self-pay

## 2016-08-21 DIAGNOSIS — D509 Iron deficiency anemia, unspecified: Secondary | ICD-10-CM

## 2016-08-21 NOTE — Telephone Encounter (Signed)
Patient is scheduled for 1st Feraheme on 08/30/16 at 9:00 at [facility].  He will need to schedule his second infusion with short stay.  New labs entered for October.   No answer. I will continue to try and reach the patient

## 2016-08-22 NOTE — Telephone Encounter (Signed)
No aswer/machine

## 2016-08-22 NOTE — Telephone Encounter (Signed)
No answer.  I will continue to try and reach the patient

## 2016-08-23 ENCOUNTER — Telehealth: Payer: Self-pay | Admitting: Internal Medicine

## 2016-08-23 NOTE — Telephone Encounter (Signed)
Patient does not wish to proceed with feraheme infusions until he talks to his insurance.  If he wants to reschedule he will call back and wants infusions at [facility].  He understands to call back when he is ready to schedule.

## 2016-08-23 NOTE — Telephone Encounter (Signed)
Patient would like to schedule Ferahemes at [facility].  He is notified I will contact him tomorrow at they are closed for the day

## 2016-08-24 NOTE — Telephone Encounter (Signed)
Patient notified of feraheme infusions for 09/04/16 and 09/11/16 both at 2 at [facility]

## 2016-08-30 ENCOUNTER — Encounter (HOSPITAL_COMMUNITY): Payer: Medicare Other

## 2016-09-04 ENCOUNTER — Encounter (HOSPITAL_COMMUNITY)
Admission: RE | Admit: 2016-09-04 | Discharge: 2016-09-04 | Disposition: A | Discharge status: Home / Self Care | Payer: Medicare Other | Source: Ambulatory Visit | Attending: Internal Medicine | Admitting: Internal Medicine

## 2016-09-04 ENCOUNTER — Encounter (HOSPITAL_COMMUNITY): Payer: Self-pay

## 2016-09-04 DIAGNOSIS — D509 Iron deficiency anemia, unspecified: Secondary | ICD-10-CM | POA: Diagnosis not present

## 2016-09-04 MED ORDER — SODIUM CHLORIDE 0.9 % IV SOLN
INTRAVENOUS | Status: DC
  Administered 2016-09-04: 14:00:00 via INTRAVENOUS

## 2016-09-04 MED ORDER — SODIUM CHLORIDE 0.9 % IV SOLN
510.0000 mg | INTRAVENOUS | Status: DC
  Administered 2016-09-04: 510 mg via INTRAVENOUS
  Filled 2016-09-04: qty 17

## 2016-09-04 NOTE — Discharge Instructions (Signed)
Feraheme °Ferumoxytol injection °What is this medicine? °FERUMOXYTOL is an iron complex. Iron is used to make healthy red blood cells, which carry oxygen and nutrients throughout the body. This medicine is used to treat iron deficiency anemia in people with chronic kidney disease. °This medicine may be used for other purposes; ask your health care provider or pharmacist if you have questions. °What should I tell my health care provider before I take this medicine? °They need to know if you have any of these conditions: °-anemia not caused by low iron levels °-high levels of iron in the blood °-magnetic resonance imaging (MRI) test scheduled °-an unusual or allergic reaction to iron, other medicines, foods, dyes, or preservatives °-pregnant or trying to get pregnant °-breast-feeding °How should I use this medicine? °This medicine is for injection into a vein. It is given by a health care professional in a hospital or clinic setting. °Talk to your pediatrician regarding the use of this medicine in children. Special care may be needed. °Overdosage: If you think you have taken too much of this medicine contact a poison control center or emergency room at once. °NOTE: This medicine is only for you. Do not share this medicine with others. °What if I miss a dose? °It is important not to miss your dose. Call your doctor or health care professional if you are unable to keep an appointment. °What may interact with this medicine? °This medicine may interact with the following medications: °-other iron products °This list may not describe all possible interactions. Give your health care provider a list of all the medicines, herbs, non-prescription drugs, or dietary supplements you use. Also tell them if you smoke, drink alcohol, or use illegal drugs. Some items may interact with your medicine. °What should I watch for while using this medicine? °Visit your doctor or healthcare professional regularly. Tell your doctor or  healthcare professional if your symptoms do not start to get better or if they get worse. You may need blood work done while you are taking this medicine. °You may need to follow a special diet. Talk to your doctor. Foods that contain iron include: whole grains/cereals, dried fruits, beans, or peas, leafy green vegetables, and organ meats (liver, kidney). °What side effects may I notice from receiving this medicine? °Side effects that you should report to your doctor or health care professional as soon as possible: °-allergic reactions like skin rash, itching or hives, swelling of the face, lips, or tongue °-breathing problems °-changes in blood pressure °-feeling faint or lightheaded, falls °-fever or chills °-flushing, sweating, or hot feelings °-swelling of the ankles or feet °Side effects that usually do not require medical attention (Report these to your doctor or health care professional if they continue or are bothersome.): °-diarrhea °-headache °-nausea, vomiting °-stomach pain °This list may not describe all possible side effects. Call your doctor for medical advice about side effects. You may report side effects to FDA at 1-800-FDA-1088. °Where should I keep my medicine? °This drug is given in a hospital or clinic and will not be stored at home. °NOTE: This sheet is a summary. It may not cover all possible information. If you have questions about this medicine, talk to your doctor, pharmacist, or health care provider. °  °© 2016, Elsevier/Gold Standard. (2012-07-26 15:23:36) ° °

## 2016-09-11 ENCOUNTER — Encounter (HOSPITAL_COMMUNITY): Payer: Self-pay

## 2016-09-11 ENCOUNTER — Encounter (HOSPITAL_COMMUNITY)
Admission: RE | Admit: 2016-09-11 | Discharge: 2016-09-11 | Disposition: A | Discharge status: Home / Self Care | Payer: Medicare Other | Source: Ambulatory Visit | Attending: Internal Medicine | Admitting: Internal Medicine

## 2016-09-11 DIAGNOSIS — D509 Iron deficiency anemia, unspecified: Secondary | ICD-10-CM | POA: Diagnosis not present

## 2016-09-11 MED ORDER — SODIUM CHLORIDE 0.9 % IV SOLN
INTRAVENOUS | Status: DC
  Administered 2016-09-11: 14:00:00 via INTRAVENOUS

## 2016-09-11 MED ORDER — SODIUM CHLORIDE 0.9 % IV SOLN
510.0000 mg | INTRAVENOUS | Status: AC
  Administered 2016-09-11: 510 mg via INTRAVENOUS
  Filled 2016-09-11: qty 17

## 2016-09-11 NOTE — Discharge Instructions (Signed)

## 2016-09-14 ENCOUNTER — Telehealth: Payer: Self-pay | Admitting: Internal Medicine

## 2016-09-14 NOTE — Telephone Encounter (Signed)
Patient notified that labs one month after 2nd infusion.  He would be due about 10/11/16

## 2016-10-11 ENCOUNTER — Other Ambulatory Visit (INDEPENDENT_AMBULATORY_CARE_PROVIDER_SITE_OTHER): Payer: Medicare Other

## 2016-10-11 DIAGNOSIS — D509 Iron deficiency anemia, unspecified: Secondary | ICD-10-CM

## 2016-10-11 LAB — CBC WITH DIFFERENTIAL/PLATELET
BASOS ABS: 0 10*3/uL (ref 0.0–0.1)
Basophils Relative: 0.3 % (ref 0.0–3.0)
EOS ABS: 0.1 10*3/uL (ref 0.0–0.7)
Eosinophils Relative: 1.7 % (ref 0.0–5.0)
HEMATOCRIT: 39.7 % (ref 39.0–52.0)
HEMOGLOBIN: 13.5 g/dL (ref 13.0–17.0)
LYMPHS PCT: 19.6 % (ref 12.0–46.0)
Lymphs Abs: 1.5 10*3/uL (ref 0.7–4.0)
MCHC: 34 g/dL (ref 30.0–36.0)
MCV: 95.2 fl (ref 78.0–100.0)
Monocytes Absolute: 0.7 10*3/uL (ref 0.1–1.0)
Monocytes Relative: 9.1 % (ref 3.0–12.0)
Neutro Abs: 5.1 10*3/uL (ref 1.4–7.7)
Neutrophils Relative %: 69.3 % (ref 43.0–77.0)
PLATELETS: 342 10*3/uL (ref 150.0–400.0)
RBC: 4.17 Mil/uL — ABNORMAL LOW (ref 4.22–5.81)
RDW: 14.2 % (ref 11.5–15.5)
WBC: 7.4 10*3/uL (ref 4.0–10.5)

## 2016-10-11 LAB — FERRITIN: FERRITIN: 161.5 ng/mL (ref 22.0–322.0)

## 2016-10-12 NOTE — Progress Notes (Signed)
Hgb NL Iron level great Hope he is ok  He is seeing me early Nov

## 2016-10-31 ENCOUNTER — Encounter: Payer: Self-pay | Admitting: Internal Medicine

## 2016-10-31 ENCOUNTER — Ambulatory Visit (INDEPENDENT_AMBULATORY_CARE_PROVIDER_SITE_OTHER): Payer: Medicare Other | Admitting: Internal Medicine

## 2016-10-31 DIAGNOSIS — K50018 Crohn's disease of small intestine with other complication: Secondary | ICD-10-CM | POA: Diagnosis not present

## 2016-10-31 DIAGNOSIS — D500 Iron deficiency anemia secondary to blood loss (chronic): Secondary | ICD-10-CM

## 2016-10-31 MED ORDER — HYOSCYAMINE SULFATE 0.125 MG SL SUBL: 0.1250 mg | SUBLINGUAL_TABLET | SUBLINGUAL | 0 refills | Status: DC | PRN

## 2016-10-31 NOTE — Assessment & Plan Note (Signed)
Lab Results  Component Value Date   FERRITIN 161.5 10/11/2016   Lab Results  Component Value Date   WBC 7.4 10/11/2016   HGB 13.5 10/11/2016   HCT 39.7 10/11/2016   MCV 95.2 10/11/2016   PLT 342.0 10/11/2016  ' Recheck CBC and Ferritin early Feb - has required feraheme because intolerant of oral iron

## 2016-10-31 NOTE — Assessment & Plan Note (Addendum)
Add Hyoscyamine prn Has been relatively stable x yrs off disease specific tx and recent attempt at budesonide Tx not effective Will Tx sxs RTC 6 mos

## 2016-10-31 NOTE — Progress Notes (Signed)
   Raymond Rubio 81 y.o. 1935/10/17 [id number]  Assessment & Plan:  Crohn's disease of small intestine ([place]) Add Hyoscyamine prn Has been relatively stable x yrs off disease specific tx and recent attempt at budesonide Tx not effective Will Tx sxs RTC 6 mos    Iron deficiency anemia due to chronic blood loss Lab Results  Component Value Date   FERRITIN 161.5 10/11/2016   Lab Results  Component Value Date   WBC 7.4 10/11/2016   HGB 13.5 10/11/2016   HCT 39.7 10/11/2016   MCV 95.2 10/11/2016   PLT 342.0 10/11/2016  ' Recheck CBC and Ferritin early Feb - has required feraheme because intolerant of oral iron  Cc: Dr. Diona Fanti Subjective:   Chief Complaint: f/u Crohn's and anemia  HPI About the same w/ episodic diarrhea managed by loperamide 2-4 mg/day prn. Tried Entocort - took 9 mg qd x 1 month - no difference - also second rx was going to be $420. Ferritin > 100 after feraheme.   Medications, allergies, past medical history, past surgical history, family history and social history are reviewed and updated in the EMR.   Review of Systems Has a bladder stone that is enlarging and may need laser Tx - seeing Dr. Diona Fanti.  Objective:   Physical Exam BP 120/72   Pulse 86   Ht 5' 8"  (1.727 m)   Wt 170 lb (77.1 kg)   BMI 25.85 kg/m  NAD  15 minutes time spent with patient > half in counseling coordination of care

## 2016-10-31 NOTE — Patient Instructions (Signed)
   Come to the lab in early Feb. 2018 to have blood drawn.  The lab is open Mon-Friday 7:30AM-5:30PM.  No appointment is needed.    Follow up with Dr Carlean Purl in 6 months.     I appreciate the opportunity to care for you. Silvano Rusk, MD, [facility]

## 2016-12-20 ENCOUNTER — Telehealth: Payer: Self-pay | Admitting: Internal Medicine

## 2016-12-20 NOTE — Telephone Encounter (Signed)
I am not aware of anyone that specializes in complications from Crohn's in [place] or elsewhere to be honest - that would be hard to find probably even at a university.  If he has seen an ophthalmologist would go back to them if not I suggest [facility]

## 2016-12-20 NOTE — Telephone Encounter (Signed)
Patient reports that he is having "eye problems".  Dry eyes  He feels like there is something in his eyes.  He feels that this is related to his crohn's. He is requesting an opthalmologist that specializes in complications from crohn's.  Please advise

## 2016-12-20 NOTE — Telephone Encounter (Signed)
Patient notified He is given the information.  He wants to research the MDs he will call back if he requires a referral from [place] to schedule.

## 2017-03-12 ENCOUNTER — Other Ambulatory Visit (INDEPENDENT_AMBULATORY_CARE_PROVIDER_SITE_OTHER): Payer: Medicare Other

## 2017-03-12 DIAGNOSIS — K50018 Crohn's disease of small intestine with other complication: Secondary | ICD-10-CM | POA: Diagnosis not present

## 2017-03-12 DIAGNOSIS — D500 Iron deficiency anemia secondary to blood loss (chronic): Secondary | ICD-10-CM | POA: Diagnosis not present

## 2017-03-12 LAB — CBC WITH DIFFERENTIAL/PLATELET
BASOS PCT: 0.5 % (ref 0.0–3.0)
Basophils Absolute: 0 10*3/uL (ref 0.0–0.1)
EOS ABS: 0.1 10*3/uL (ref 0.0–0.7)
Eosinophils Relative: 0.7 % (ref 0.0–5.0)
HCT: 39.1 % (ref 39.0–52.0)
HEMOGLOBIN: 13.1 g/dL (ref 13.0–17.0)
Lymphocytes Relative: 15.8 % (ref 12.0–46.0)
Lymphs Abs: 1.3 10*3/uL (ref 0.7–4.0)
MCHC: 33.5 g/dL (ref 30.0–36.0)
MCV: 94.3 fl (ref 78.0–100.0)
MONO ABS: 0.9 10*3/uL (ref 0.1–1.0)
Monocytes Relative: 10.7 % (ref 3.0–12.0)
Neutro Abs: 5.9 10*3/uL (ref 1.4–7.7)
Neutrophils Relative %: 72.3 % (ref 43.0–77.0)
Platelets: 432 10*3/uL — ABNORMAL HIGH (ref 150.0–400.0)
RBC: 4.15 Mil/uL — AB (ref 4.22–5.81)
RDW: 13.1 % (ref 11.5–15.5)
WBC: 8.1 10*3/uL (ref 4.0–10.5)

## 2017-03-12 LAB — FERRITIN: FERRITIN: 39.6 ng/mL (ref 22.0–322.0)

## 2017-03-13 NOTE — Progress Notes (Signed)
Hgb NL Iron levels normal but lower than in past I hope he is doing ok - if he can tolerate oral iron ask him to take ferrous sulfate 325 mg daily He is coming 5/8 and can regroup more then

## 2017-04-09 ENCOUNTER — Other Ambulatory Visit: Payer: Self-pay | Admitting: Urology

## 2017-04-19 ENCOUNTER — Encounter (HOSPITAL_BASED_OUTPATIENT_CLINIC_OR_DEPARTMENT_OTHER): Payer: Self-pay | Admitting: *Deleted

## 2017-04-19 NOTE — Progress Notes (Signed)
NPO AFTER MN.  ARRIVE AT 8850.  NEEDS HG.  MAY TAKE IMODIUM AM DOS W/ SIPS OF WATER IF NEEDED.

## 2017-04-30 ENCOUNTER — Encounter (HOSPITAL_BASED_OUTPATIENT_CLINIC_OR_DEPARTMENT_OTHER): Payer: Self-pay

## 2017-04-30 ENCOUNTER — Ambulatory Visit (HOSPITAL_BASED_OUTPATIENT_CLINIC_OR_DEPARTMENT_OTHER)
Admission: RE | Admit: 2017-04-30 | Discharge: 2017-04-30 | Disposition: A | Discharge status: Home / Self Care | Payer: Medicare Other | Source: Ambulatory Visit | Attending: Urology | Admitting: Urology

## 2017-04-30 ENCOUNTER — Ambulatory Visit (HOSPITAL_BASED_OUTPATIENT_CLINIC_OR_DEPARTMENT_OTHER): Payer: Medicare Other | Admitting: Anesthesiology

## 2017-04-30 ENCOUNTER — Encounter (HOSPITAL_BASED_OUTPATIENT_CLINIC_OR_DEPARTMENT_OTHER)
Admission: RE | Disposition: A | Discharge status: Home / Self Care | Payer: Self-pay | Source: Ambulatory Visit | Attending: Urology

## 2017-04-30 DIAGNOSIS — N210 Calculus in bladder: Secondary | ICD-10-CM | POA: Diagnosis not present

## 2017-04-30 DIAGNOSIS — Z87891 Personal history of nicotine dependence: Secondary | ICD-10-CM | POA: Insufficient documentation

## 2017-04-30 DIAGNOSIS — K5090 Crohn's disease, unspecified, without complications: Secondary | ICD-10-CM | POA: Diagnosis not present

## 2017-04-30 DIAGNOSIS — N400 Enlarged prostate without lower urinary tract symptoms: Secondary | ICD-10-CM | POA: Insufficient documentation

## 2017-04-30 DIAGNOSIS — Z86718 Personal history of other venous thrombosis and embolism: Secondary | ICD-10-CM | POA: Diagnosis not present

## 2017-04-30 DIAGNOSIS — Z79899 Other long term (current) drug therapy: Secondary | ICD-10-CM | POA: Insufficient documentation

## 2017-04-30 DIAGNOSIS — M1990 Unspecified osteoarthritis, unspecified site: Secondary | ICD-10-CM | POA: Diagnosis not present

## 2017-04-30 DIAGNOSIS — E7800 Pure hypercholesterolemia, unspecified: Secondary | ICD-10-CM | POA: Diagnosis not present

## 2017-04-30 DIAGNOSIS — M797 Fibromyalgia: Secondary | ICD-10-CM | POA: Insufficient documentation

## 2017-04-30 DIAGNOSIS — Z7982 Long term (current) use of aspirin: Secondary | ICD-10-CM | POA: Insufficient documentation

## 2017-04-30 HISTORY — DX: Benign neoplasm of peripheral nerves and autonomic nervous system, unspecified: D36.10

## 2017-04-30 HISTORY — DX: Benign neoplasm of cranial nerves: D33.3

## 2017-04-30 HISTORY — DX: Personal history of transient ischemic attack (TIA), and cerebral infarction without residual deficits: Z86.73

## 2017-04-30 HISTORY — PX: HOLMIUM LASER APPLICATION: SHX5852

## 2017-04-30 HISTORY — DX: Noninfective gastroenteritis and colitis, unspecified: K52.9

## 2017-04-30 HISTORY — DX: Crohn's disease of small intestine without complications: K50.00

## 2017-04-30 HISTORY — DX: Benign prostatic hyperplasia without lower urinary tract symptoms: N40.0

## 2017-04-30 HISTORY — DX: Unspecified osteoarthritis, unspecified site: M19.90

## 2017-04-30 HISTORY — DX: Male erectile dysfunction, unspecified: N52.9

## 2017-04-30 HISTORY — DX: Personal history of other specified conditions: Z87.898

## 2017-04-30 HISTORY — DX: Other complications of anesthesia, initial encounter: T88.59XA

## 2017-04-30 HISTORY — DX: Elevated prostate specific antigen (PSA): R97.20

## 2017-04-30 HISTORY — DX: Calculus in bladder: N21.0

## 2017-04-30 HISTORY — DX: Personal history of other diseases of urinary system: Z87.448

## 2017-04-30 HISTORY — DX: Personal history of other venous thrombosis and embolism: Z86.718

## 2017-04-30 HISTORY — PX: CYSTOSCOPY WITH LITHOLAPAXY: SHX1425

## 2017-04-30 HISTORY — DX: Adverse effect of unspecified anesthetic, initial encounter: T41.45XA

## 2017-04-30 SURGERY — CYSTOSCOPY, WITH BLADDER CALCULUS LITHOLAPAXY
Anesthesia: General

## 2017-04-30 MED ORDER — DEXAMETHASONE SODIUM PHOSPHATE 4 MG/ML IJ SOLN
INTRAMUSCULAR | Status: DC | PRN
  Administered 2017-04-30: 5 mg via INTRAVENOUS

## 2017-04-30 MED ORDER — PROPOFOL 10 MG/ML IV BOLUS
INTRAVENOUS | Status: AC
  Filled 2017-04-30: qty 20

## 2017-04-30 MED ORDER — CEFAZOLIN SODIUM-DEXTROSE 2-4 GM/100ML-% IV SOLN
INTRAVENOUS | Status: AC
  Filled 2017-04-30: qty 100

## 2017-04-30 MED ORDER — LIDOCAINE 2% (20 MG/ML) 5 ML SYRINGE
INTRAMUSCULAR | Status: AC
  Filled 2017-04-30: qty 5

## 2017-04-30 MED ORDER — ONDANSETRON HCL 4 MG/2ML IJ SOLN
INTRAMUSCULAR | Status: DC | PRN
  Administered 2017-04-30: 4 mg via INTRAVENOUS

## 2017-04-30 MED ORDER — ONDANSETRON HCL 4 MG/2ML IJ SOLN
INTRAMUSCULAR | Status: AC
  Filled 2017-04-30: qty 2

## 2017-04-30 MED ORDER — FENTANYL CITRATE (PF) 100 MCG/2ML IJ SOLN
25.0000 ug | INTRAMUSCULAR | Status: DC | PRN
  Filled 2017-04-30: qty 1

## 2017-04-30 MED ORDER — FENTANYL CITRATE (PF) 100 MCG/2ML IJ SOLN
INTRAMUSCULAR | Status: AC
  Filled 2017-04-30: qty 2

## 2017-04-30 MED ORDER — FENTANYL CITRATE (PF) 100 MCG/2ML IJ SOLN
INTRAMUSCULAR | Status: DC | PRN
  Administered 2017-04-30 (×2): 25 ug via INTRAVENOUS
  Administered 2017-04-30: 50 ug via INTRAVENOUS

## 2017-04-30 MED ORDER — DEXAMETHASONE SODIUM PHOSPHATE 10 MG/ML IJ SOLN
INTRAMUSCULAR | Status: AC
  Filled 2017-04-30: qty 1

## 2017-04-30 MED ORDER — OXYBUTYNIN CHLORIDE 5 MG PO TABS: 5.0000 mg | ORAL_TABLET | Freq: Three times a day (TID) | ORAL | 1 refills | Status: DC | PRN

## 2017-04-30 MED ORDER — EPHEDRINE SULFATE-NACL 50-0.9 MG/10ML-% IV SOSY
PREFILLED_SYRINGE | INTRAVENOUS | Status: DC | PRN
  Administered 2017-04-30 (×2): 10 mg via INTRAVENOUS

## 2017-04-30 MED ORDER — MIDAZOLAM HCL 2 MG/2ML IJ SOLN
INTRAMUSCULAR | Status: AC
  Filled 2017-04-30: qty 2

## 2017-04-30 MED ORDER — PROPOFOL 10 MG/ML IV BOLUS
INTRAVENOUS | Status: DC | PRN
  Administered 2017-04-30: 20 mg via INTRAVENOUS
  Administered 2017-04-30: 150 mg via INTRAVENOUS

## 2017-04-30 MED ORDER — LACTATED RINGERS IV SOLN
INTRAVENOUS | Status: DC
  Administered 2017-04-30 (×2): via INTRAVENOUS
  Filled 2017-04-30: qty 1000

## 2017-04-30 MED ORDER — CEFAZOLIN SODIUM-DEXTROSE 2-4 GM/100ML-% IV SOLN
2.0000 g | INTRAVENOUS | Status: AC
  Administered 2017-04-30: 2 g via INTRAVENOUS
  Filled 2017-04-30: qty 100

## 2017-04-30 MED ORDER — SULFAMETHOXAZOLE-TRIMETHOPRIM 800-160 MG PO TABS: 1.0000 | ORAL_TABLET | Freq: Two times a day (BID) | ORAL | 0 refills | Status: DC

## 2017-04-30 MED ORDER — LIDOCAINE 2% (20 MG/ML) 5 ML SYRINGE
INTRAMUSCULAR | Status: DC | PRN
  Administered 2017-04-30: 70 mg via INTRAVENOUS

## 2017-04-30 MED ORDER — OXYBUTYNIN CHLORIDE 5 MG PO TABS
ORAL_TABLET | ORAL | Status: AC
  Filled 2017-04-30: qty 1

## 2017-04-30 MED ORDER — EPHEDRINE 5 MG/ML INJ
INTRAVENOUS | Status: AC
  Filled 2017-04-30: qty 10

## 2017-04-30 SURGICAL SUPPLY — 37 items
BAG DRAIN URO-CYSTO SKYTR STRL (DRAIN) ×3 IMPLANT
BAG DRN UROCATH (DRAIN) ×1
BASKET LASER NITINOL 1.9FR (BASKET) IMPLANT
BASKET STNLS GEMINI 4WIRE 3FR (BASKET) IMPLANT
BASKET ZERO TIP NITINOL 2.4FR (BASKET) IMPLANT
BSKT STON RTRVL 120 1.9FR (BASKET)
BSKT STON RTRVL GEM 120X11 3FR (BASKET)
BSKT STON RTRVL ZERO TP 2.4FR (BASKET)
CARTRIDGE STONEBREAK CO2 KIDNE (ELECTROSURGICAL) IMPLANT
CATH INTERMIT  6FR 70CM (CATHETERS) IMPLANT
CATH URET 5FR 28IN CONE TIP (BALLOONS)
CATH URET 5FR 28IN OPEN ENDED (CATHETERS) IMPLANT
CATH URET 5FR 70CM CONE TIP (BALLOONS) IMPLANT
CLOTH BEACON ORANGE TIMEOUT ST (SAFETY) ×4 IMPLANT
FIBER LASER FLEXIVA 1000 (UROLOGICAL SUPPLIES) ×2 IMPLANT
FIBER LASER FLEXIVA 365 (UROLOGICAL SUPPLIES) IMPLANT
FIBER LASER FLEXIVA 550 (UROLOGICAL SUPPLIES) IMPLANT
FIBER LASER TRAC TIP (UROLOGICAL SUPPLIES) IMPLANT
GLOVE BIO SURGEON STRL SZ8 (GLOVE) ×3 IMPLANT
GOWN STRL REUS W/ TWL XL LVL3 (GOWN DISPOSABLE) ×1 IMPLANT
GOWN STRL REUS W/TWL XL LVL3 (GOWN DISPOSABLE) ×6 IMPLANT
GUIDEWIRE 0.038 PTFE COATED (WIRE) IMPLANT
GUIDEWIRE ANG ZIPWIRE 038X150 (WIRE) IMPLANT
GUIDEWIRE STR DUAL SENSOR (WIRE) IMPLANT
KIT BALLIN UROMAX 15FX10 (LABEL) IMPLANT
KIT BALLN UROMAX 15FX4 (MISCELLANEOUS) IMPLANT
KIT BALLN UROMAX 26 75X4 (MISCELLANEOUS)
KIT RM TURNOVER CYSTO AR (KITS) ×3 IMPLANT
LASER FIBER DISP (UROLOGICAL SUPPLIES) IMPLANT
MANIFOLD NEPTUNE II (INSTRUMENTS) ×3 IMPLANT
PACK CYSTO (CUSTOM PROCEDURE TRAY) ×3 IMPLANT
PROBE PNEUMATIC 1.6MM (ELECTROSURGICAL) IMPLANT
SET HIGH PRES BAL DIL (LABEL)
SYRINGE IRR TOOMEY STRL 70CC (SYRINGE) ×2 IMPLANT
TUBE CONNECTING 12'X1/4 (SUCTIONS) ×1
TUBE CONNECTING 12X1/4 (SUCTIONS) ×2 IMPLANT
WATER STERILE IRR 500ML POUR (IV SOLUTION) IMPLANT

## 2017-04-30 NOTE — Transfer of Care (Signed)
  Last Vitals:  Vitals:   04/30/17 0814 04/30/17 1027  BP: 137/80 133/86  Pulse: 92 81  Resp: 16 10  Temp: 36.7 C 36.9 C    Last Pain:  Vitals:   04/30/17 0814  TempSrc: Oral      Patients Stated Pain Goal: 3 (04/30/17 9597)  Immediate Anesthesia Transfer of Care Note  Patient: Raymond Rubio  Procedure(s) Performed: Procedure(s) (LRB): CYSTOSCOPY WITH LITHOLAPAXY (N/A) HOLMIUM LASER APPLICATION (N/A)  Patient Location: PACU  Anesthesia Type: General  Level of Consciousness: awake, alert  and oriented  Airway & Oxygen Therapy: Patient Spontanous Breathing and Patient connected to nasal cannula oxygen  Post-op Assessment: Report given to PACU RN and Post -op Vital signs reviewed and stable  Post vital signs: Reviewed and stable  Complications: No apparent anesthesia complications

## 2017-04-30 NOTE — Op Note (Signed)
Preoperative diagnosis: Bladder calculus, 22 millimeters  Postoperative diagnosis: Same  Principal procedure: Cystolitholapaxy  Surgeon: Slayde Brault  Anesthesia: Gen. with LMA  Complications: None.  Specimen: Stone fragments.  Drains: 44 French Foley catheter  Indications: 82 year old male with symptomatic 22 millimeter bladder calculus.  This has been identified in the past, and the patient has been reticent to have this treated.  However, he has had recent discomfort with urination, occasional hematuria.  He presents at this time for cystoscopic management of this with the holmium laser.  The risks and complications the procedure have been discussed with the patient.  These include but are not limited to difficulty voiding afterwards, bleeding, infection, anesthetic complications, among others.  He understands these and desires to proceed.  Findings: 22 millimeter spiculated stone, layering posteriorly in the bladder.  No bladder lesions were noted.  Ureteral orifices were normal in configuration and location.  Prostate was somewhat obstructive with the median lobe.  The patient has not 1.  It surgical management of his BPH.  Description of procedure: The patient was properly identified in the holding area.  He received preoperative IV antibiotics.  He was taken to the operating room where general anesthetic was administered with the LMA.  He was placed in the dorsolithotomy position.  Genitalia and perineum were prepped and draped.  Proper timeout was performed.  A 21 French panendoscope was advanced under direct vision through the urethra utilizing the 30 lens.  An obstructive prostate was noted.  The previously mentioned stone was present.  The thousand micron laser fiber was then passed through the cystoscope.  Using laser energy at 1.0 joules as well as 30 hertz, the stone was fragmented into multiple smaller fragments.  These were then drained from the bladder for placing the beak of the  scope over top.  Then, or, the smaller fragments were irrigated with the Toomey syringe.  Following adequate fragmentation, and evacuation of all large and small fragments, including a small amount of dust, the bladder was seen to be totally evacuated of stone material.  No bladder trauma was noted.  The scope was then removed.  An 54 French Foley catheter was then placed and hooked to dependent drainage/leg bag.  The patient tolerated procedure well.  His awakened and taken to PACU in stable condition.

## 2017-04-30 NOTE — Interval H&P Note (Signed)
History and Physical Interval Note:  04/30/2017 9:41 AM  Raymond Rubio  has presented today for surgery, with the diagnosis of BLADDER STONES  The various methods of treatment have been discussed with the patient and family. After consideration of risks, benefits and other options for treatment, the patient has consented to  Procedure(s): CYSTOSCOPY WITH LITHOLAPAXY (N/A) HOLMIUM LASER APPLICATION (N/A) as a surgical intervention .  The patient's history has been reviewed, patient examined, no change in status, stable for surgery.  I have reviewed the patient's chart and labs.  Questions were answered to the patient's satisfaction.     Jorja Loa

## 2017-04-30 NOTE — Anesthesia Procedure Notes (Signed)
Procedure Name: LMA Insertion Date/Time: 04/30/2017 9:51 AM Performed by: Lyndle Herrlich Pre-anesthesia Checklist: Patient identified, Emergency Drugs available, Suction available and Patient being monitored Patient Re-evaluated:Patient Re-evaluated prior to inductionOxygen Delivery Method: Circle system utilized Preoxygenation: Pre-oxygenation with 100% oxygen Intubation Type: IV induction Ventilation: Mask ventilation without difficulty LMA: LMA inserted LMA Size: 4.0 Number of attempts: 1 Airway Equipment and Method: Bite block Placement Confirmation: positive ETCO2 Tube secured with: Tape Dental Injury: Teeth and Oropharynx as per pre-operative assessment

## 2017-04-30 NOTE — Anesthesia Preprocedure Evaluation (Signed)
Anesthesia Evaluation  Patient identified by MRN, date of birth, ID band Patient awake    Reviewed: Allergy & Precautions, H&P , Patient's Chart, lab work & pertinent test results, reviewed documented beta blocker date and time   Airway Mallampati: II  TM Distance: >3 FB Neck ROM: full    Dental no notable dental hx.    Pulmonary former smoker,    Pulmonary exam normal breath sounds clear to auscultation       Cardiovascular  Rhythm:regular Rate:Normal     Neuro/Psych    GI/Hepatic   Endo/Other    Renal/GU      Musculoskeletal   Abdominal   Peds  Hematology   Anesthesia Other Findings No CVA residuals  Reproductive/Obstetrics                            Anesthesia Physical Anesthesia Plan  ASA: II  Anesthesia Plan: General   Post-op Pain Management:    Induction: Intravenous  Airway Management Planned: LMA  Additional Equipment:   Intra-op Plan:   Post-operative Plan:   Informed Consent: I have reviewed the patients History and Physical, chart, labs and discussed the procedure including the risks, benefits and alternatives for the proposed anesthesia with the patient or authorized representative who has indicated his/her understanding and acceptance.   Dental Advisory Given  Plan Discussed with: CRNA and Surgeon  Anesthesia Plan Comments: ( )        Anesthesia Quick Evaluation

## 2017-04-30 NOTE — Discharge Instructions (Signed)
Post Anesthesia Home Care Instructions  Activity: Get plenty of rest for the remainder of the day. A responsible individual must stay with you for 24 hours following the procedure.  For the next 24 hours, DO NOT: -Drive a car -Paediatric nurse -Drink alcoholic beverages -Take any medication unless instructed by your physician -Make any legal decisions or sign important papers.  Meals: Start with liquid foods such as gelatin or soup. Progress to regular foods as tolerated. Avoid greasy, spicy, heavy foods. If nausea and/or vomiting occur, drink only clear liquids until the nausea and/or vomiting subsides. Call your physician if vomiting continues.  Special Instructions/Symptoms: Your throat may feel dry or sore from the anesthesia or the breathing tube placed in your throat during surgery. If this causes discomfort, gargle with warm salt water. The discomfort should disappear within 24 hours.  If you had a scopolamine patch placed behind your ear for the management of post- operative nausea and/or vomiting:  1. The medication in the patch is effective for 72 hours, after which it should be removed.  Wrap patch in a tissue and discard in the trash. Wash hands thoroughly with soap and water. 2. You may remove the patch earlier than 72 hours if you experience unpleasant side effects which may include dry mouth, dizziness or visual disturbances. 3. Avoid touching the patch. Wash your hands with soap and water after contact with the patch.   CYSTOSCOPY HOME CARE INSTRUCTIONS  Activity: Rest for the remainder of the day.  Do not drive or operate equipment today.  You may resume normal activities in one to two days as instructed by your physician.   Meals: Drink plenty of liquids and eat light foods such as gelatin or soup this evening.  You may return to a normal meal plan tomorrow.  Return to Work: You may return to work in one to two days or as instructed by your physician.  Special  Instructions / Symptoms: Call your physician if any of these symptoms occur:   -persistent or heavy bleeding  -bleeding which continues after first few urination  -large blood clots that are difficult to pass  -urine stream diminishes or stops completely  -fever equal to or higher than 101 degrees Farenheit.  -cloudy urine with a strong, foul odor  -severe pain  Females should always wipe from front to back after elimination.  You may feel some burning pain when you urinate.  This should disappear with time.  Applying moist heat to the lower abdomen or a hot tub bath may help relieve the pain. \  Follow-Up / Date of Return Visit to Your Physician: Call for an appointment to arrange follow-up.  Patient Signature:  ________________________________________________________  Nurse's Signature:  ________________________________________________________   Indwelling Urinary Catheter Care, Adult Take good care of your catheter to keep it working and to prevent problems. How to wear your catheter Attach your catheter to your leg with tape (adhesive tape) or a leg strap. Make sure it is not too tight. If you use tape, remove any bits of tape that are already on the catheter. How to wear a drainage bag You should have:  A large overnight bag.  A small leg bag. Overnight Bag  You may wear the overnight bag at any time. Always keep the bag below the level of your bladder but off the floor. When you sleep, put a clean plastic bag in a wastebasket. Then hang the bag inside the wastebasket. Leg Bag  Never wear the leg  bag at night. Always wear the leg bag below your knee. Keep the leg bag secure with a leg strap or tape. How to care for your skin  Clean the skin around the catheter at least once every day.  Shower every day. Do not take baths.  Put creams, lotions, or ointments on your genital area only as told by your doctor.  Do not use powders, sprays, or lotions on your genital  area. How to clean your catheter and your skin 1. Wash your hands with soap and water. 2. Wet a washcloth in warm water and gentle (mild) soap. 3. Use the washcloth to clean the skin where the catheter enters your body. Clean downward and wipe away from the catheter in small circles. Do not wipe toward the catheter. 4. Pat the area dry with a clean towel. Make sure to clean off all soap. How to care for your drainage bags Empty your drainage bag when it is ?- full or at least 2-3 times a day. Replace your drainage bag once a month or sooner if it starts to smell bad or look dirty. Do not clean your drainage bag unless told by your doctor. Emptying a drainage bag   Supplies Needed  Rubbing alcohol.  Gauze pad or cotton ball.  Tape or a leg strap. Steps 1. Wash your hands with soap and water. 2. Separate (detach) the bag from your leg. 3. Hold the bag over the toilet or a clean container. Keep the bag below your hips and bladder. This stops pee (urine) from going back into the tube. 4. Open the pour spout at the bottom of the bag. 5. Empty the pee into the toilet or container. Do not let the pour spout touch any surface. 6. Put rubbing alcohol on a gauze pad or cotton ball. 7. Use the gauze pad or cotton ball to clean the pour spout. 8. Close the pour spout. 9. Attach the bag to your leg with tape or a leg strap. 10. Wash your hands. Changing a drainage bag  Supplies Needed  Alcohol wipes.  A clean drainage bag.  Adhesive tape or a leg strap. Steps 1. Wash your hands with soap and water. 2. Separate the dirty bag from your leg. 3. Pinch the rubber catheter with your fingers so that pee does not spill out. 4. Separate the catheter tube from the drainage tube where these tubes connect (at the connection valve). Do not let the tubes touch any surface. 5. Clean the end of the catheter tube with an alcohol wipe. Use a different alcohol wipe to clean the end of the drainage  tube. 6. Connect the catheter tube to the drainage tube of the clean bag. 7. Attach the new bag to the leg with adhesive tape or a leg strap. 8. Wash your hands. How to prevent infection and other problems  Never pull on your catheter or try to remove it. Pulling can damage tissue in your body.  Always wash your hands before and after touching your catheter.  If a leg strap gets wet, replace it with a dry one.  Drink enough fluids to keep your pee clear or pale yellow, or as told by your doctor.  Do not let the drainage bag or tubing touch the floor.  Wear cotton underwear.  If you are male, wipe from front to back after you poop (have a bowel movement).  Check on the catheter often to make sure it works and the tubing is not  twisted. Get help if:  Your pee is cloudy.  Your pee smells unusually bad.  Your pee is not draining into the bag.  Your tube gets clogged.  Your catheter starts to leak.  Your bladder feels full. Get help right away if:  You have redness, swelling, or pain where the catheter enters your body.  You have fluid, pus, or a bad smell coming from the area where the catheter enters your body.  The area where the catheter enters your body feels warm.  You have a fever.  You have pain in your:  Stomach (abdomen).  Legs.  Lower back.  Bladder.  You see blood fill the catheter.  Your pee is pink or red.  You feel sick to your stomach (nauseous).  You throw up (vomit).  You have chills.  Your catheter gets pulled out. This information is not intended to replace advice given to you by your health care provider. Make sure you discuss any questions you have with your health care provider. Document Released: 04/07/2013 Document Revised: 11/08/2016 Document Reviewed: 05/26/2014 Elsevier Interactive Patient Education  2017 [employer].

## 2017-04-30 NOTE — H&P (Signed)
H&P  Chief Complaint: Bladder stone  History of Present Illness: 17 year oold male with a 2.2 cm bladder stone, presents for cystolithalopaxy.  Past Medical History:  Diagnosis Date  . Arthritis   . Benign schwannoma    per pt lumbar region  . Bladder calculi   . BPH with elevated PSA   . Chronic diarrhea    crohn's  . Complication of anesthesia    per pt surgery aborted in 1990's when doing ankle block, when given ketamine causes hypotension and psychotic recovery   . Crohn's disease of small intestine ([place]) followed by dr Carlean Purl   per pt dx 1972  . ED (erectile dysfunction)   . Erectile dysfunction due to arterial insufficiency   . Fibromyalgia   . History of bladder stone   . 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 History of seizure 12/ 05/ 2005   x2 in setting of right ICH and Left frontal ischemic infarct  . Hypercholesterolemia   . Hypogonadism, testicular   . Iron deficiency anemia due to chronic blood loss    followed by dr Carlean Purl--  treatment IV Iron-- last infusion 10/ 2017  . Left acoustic neuroma ([place])   . Meckel's diverticulum    small instestine    Past Surgical History:  Procedure Laterality Date  . CYSTO/  BLADDER STONE EXTRACTION  2006  ?  Marland Kitchen HIP FRACTURE SURGERY Right 05/11/2004   femoral pinning  . TONSILLECTOMY AND ADENOIDECTOMY  child    Home Medications:    Allergies:  Allergies  Allergen Reactions  . Ketamine Other (See Comments)    Causes hypotension and psychotic recovery     Family History  Problem Relation Age of Onset  . Diverticulitis Brother   . Kidney Stones Brother   . Esophageal cancer Neg Hx   . Colon cancer  Neg Hx   . Pancreatic cancer Neg Hx   . Stomach cancer Neg Hx   . Diabetes Neg Hx   . Kidney disease Neg Hx   . Liver disease Neg Hx     Social History:  reports that he quit smoking about 58 years ago. He has never used smokeless tobacco. He reports that he does not drink alcohol or use drugs.  ROS: A complete review of systems was performed.  All systems are negative except for pertinent findings as noted.  Physical Exam:  Vital signs in last 24 hours: Temp:  [98 F (36.7 C)] 98 F (36.7 C) (05/07 0814) Pulse Rate:  [92] 92 (05/07 0814) Resp:  [16] 16 (05/07 0814) BP: (137)/(80) 137/80 (05/07 0814) SpO2:  [100 %] 100 % (05/07 0814) Weight:  [74.8 kg (165 lb)] 74.8 kg (165 lb) (05/07 0814) Constitutional:  Alert and oriented, No acute distress Cardiovascular: Regular rate and rhythm, No JVD Respiratory: Normal respiratory effort, Lungs clear bilaterally GI: Abdomen is soft, nontender, nondistended, no abdominal masses Genitourinary: No CVAT. Normal male phallus, testes are descended bilaterally and non-tender and without masses, scrotum is normal in appearance without lesions or masses, perineum is normal on inspection. Rectal: Normal sphincter tone, no rectal masses, prostate is non tender and without nodularity. Prostate size is estimated to be 100 cc Lymphatic: No lymphadenopathy  Neurologic: Grossly intact, no focal deficits Psychiatric: Normal mood and affect  Laboratory Data:  No results for input(s): WBC, HGB, HCT, PLT in the last 72 hours.  No results for input(s): NA, K, CL, GLUCOSE, BUN, CALCIUM, CREATININE in the last 72 hours.  Invalid input(s): CO3   No results found for this or any previous visit (from the past 24 hour(s)). No results found for this or any previous visit (from the past 240 hour(s)).  Renal Function: No results for input(s): CREATININE in the last 168 hours. CrCl cannot be calculated (Patient's most recent lab result is older than the  maximum 21 days allowed.).  Radiologic Imaging: No results found.  Impression/Assessment:  Bladder stone  Plan:  Cystolithalopaxy

## 2017-05-01 ENCOUNTER — Ambulatory Visit: Payer: Medicare Other | Admitting: Internal Medicine

## 2017-05-01 ENCOUNTER — Encounter (HOSPITAL_BASED_OUTPATIENT_CLINIC_OR_DEPARTMENT_OTHER): Payer: Self-pay | Admitting: Urology

## 2017-05-01 LAB — POCT HEMOGLOBIN-HEMACUE: HEMOGLOBIN: 14 g/dL (ref 13.0–17.0)

## 2017-05-01 NOTE — Anesthesia Postprocedure Evaluation (Addendum)
Anesthesia Post Note  Patient: Mauri Tolen Brossard  Procedure(s) Performed: Procedure(s) (LRB): CYSTOSCOPY WITH LITHOLAPAXY (N/A) HOLMIUM LASER APPLICATION (N/A)  Patient location during evaluation: PACU Anesthesia Type: General Level of consciousness: awake and alert Pain management: pain level controlled Vital Signs Assessment: post-procedure vital signs reviewed and stable Respiratory status: spontaneous breathing, nonlabored ventilation, respiratory function stable and patient connected to nasal cannula oxygen Cardiovascular status: blood pressure returned to baseline and stable Postop Assessment: no signs of nausea or vomiting Anesthetic complications: no       Last Vitals:  Vitals:   04/30/17 1100 04/30/17 1148  BP: 131/88 (!) 143/84  Pulse: 65 69  Resp: (!) 9 16  Temp:  36.5 C    Last Pain:  Vitals:   04/30/17 1148  TempSrc: Oral                 Nohealani Medinger EDWARD

## 2017-05-14 ENCOUNTER — Encounter: Payer: Self-pay | Admitting: Internal Medicine

## 2017-05-14 ENCOUNTER — Ambulatory Visit (INDEPENDENT_AMBULATORY_CARE_PROVIDER_SITE_OTHER): Payer: Medicare Other | Admitting: Internal Medicine

## 2017-05-14 DIAGNOSIS — D500 Iron deficiency anemia secondary to blood loss (chronic): Secondary | ICD-10-CM | POA: Diagnosis not present

## 2017-05-14 DIAGNOSIS — K5000 Crohn's disease of small intestine without complications: Secondary | ICD-10-CM

## 2017-05-14 NOTE — Assessment & Plan Note (Signed)
Stable See if VSL#3 DS 900 helps if it does ? Continue or 450

## 2017-05-14 NOTE — Progress Notes (Signed)
   NICHOLAD KAUTZMAN 82 y.o. [birth year] [id number]  Assessment & Plan:    Encounter Diagnoses  Name Primary?  . Crohn's disease of small intestine without complication ([place])   . Iron deficiency anemia due to chronic blood loss    Observe at this point. He will return in about 6 months. Labs in September. Trial of the SL #3 DS.  Subjective:   Chief Complaint:Follow-up of Crohn's and iron deficiency  HPI  Overall he feels like he is managing well on no therapy. Does not want to make any changes right now. Medications, allergies, past medical history, past surgical history, family history and social history are reviewed and updated in the EMR.  Review of Systems As above  Objective:   Physical Exam BP 134/62   Pulse 82   Ht 5' 10"  (1.778 m)   Wt 170 lb 9.6 oz (77.4 kg)   BMI 24.48 kg/m  No acute distress

## 2017-05-14 NOTE — Assessment & Plan Note (Signed)
Cbc ferritin 08/2017

## 2017-05-14 NOTE — Patient Instructions (Signed)
  Today we are giving you VSL #3 DS samples, take one sachet daily and let us know in a month how your feeling.   Come back early September for labs, no appointment needed.  The lab is open from 7:30AM-5:30PM, Monday-Friday    I appreciate the opportunity to care for you. Silvano Rusk, MD, [facility]

## 2017-07-06 NOTE — Anesthesia Postprocedure Evaluation (Signed)
Anesthesia Post Note  Patient: Raymond Rubio  Procedure(s) Performed: Procedure(s) (LRB): CYSTOSCOPY WITH LITHOLAPAXY (N/A) HOLMIUM LASER APPLICATION (N/A)     Anesthesia Post Evaluation  Last Vitals:  Vitals:   04/30/17 1100 04/30/17 1148  BP: 131/88 (!) 143/84  Pulse: 65 69  Resp: (!) 9 16  Temp:  36.5 C    Last Pain:  Vitals:   04/30/17 1148  TempSrc: Oral                 [name] EDWARD

## 2017-07-06 NOTE — Addendum Note (Signed)
Addendum  created 07/06/17 1230 by Lyndle Herrlich, MD   Sign clinical note

## 2018-02-27 ENCOUNTER — Telehealth: Payer: Self-pay | Admitting: Internal Medicine

## 2018-02-27 DIAGNOSIS — K50119 Crohn's disease of large intestine with unspecified complications: Secondary | ICD-10-CM

## 2018-02-28 NOTE — Telephone Encounter (Signed)
Phone line busy.  I will continue to try and reach the patient

## 2018-03-04 NOTE — Telephone Encounter (Signed)
Ok on the ESR   

## 2018-03-04 NOTE — Telephone Encounter (Signed)
He reports that he is having occasional flares he is managing his Crohn's with a prescription of prednisone that was prescribed in December of 2016.  He says he has had 2 flares in the last few months, but "doing great" now.  He says you have told him that he doesn't need to call with a flare, to just take the prednisone he has at home.  He would like to add a sed rate to the labs.  Please advise

## 2018-03-04 NOTE — Telephone Encounter (Signed)
Yes  Was supposed to do CBC and ferritin in 08/2017  Lab orders are in

## 2018-03-04 NOTE — Telephone Encounter (Signed)
Dr. Carlean Purl would you like labs before OV? Trying to reach him about reported flare/

## 2018-03-04 NOTE — Telephone Encounter (Signed)
Patient notified He will come for the labs at his convenience

## 2018-05-06 ENCOUNTER — Other Ambulatory Visit (INDEPENDENT_AMBULATORY_CARE_PROVIDER_SITE_OTHER): Payer: Medicare Other

## 2018-05-06 DIAGNOSIS — D500 Iron deficiency anemia secondary to blood loss (chronic): Secondary | ICD-10-CM

## 2018-05-06 DIAGNOSIS — K5000 Crohn's disease of small intestine without complications: Secondary | ICD-10-CM | POA: Diagnosis not present

## 2018-05-06 DIAGNOSIS — K50119 Crohn's disease of large intestine with unspecified complications: Secondary | ICD-10-CM

## 2018-05-06 LAB — CBC WITH DIFFERENTIAL/PLATELET
BASOS ABS: 0 10*3/uL (ref 0.0–0.1)
Basophils Relative: 0.6 % (ref 0.0–3.0)
EOS ABS: 0.1 10*3/uL (ref 0.0–0.7)
Eosinophils Relative: 2 % (ref 0.0–5.0)
HCT: 39.2 % (ref 39.0–52.0)
Hemoglobin: 13.2 g/dL (ref 13.0–17.0)
LYMPHS ABS: 1.9 10*3/uL (ref 0.7–4.0)
Lymphocytes Relative: 31.3 % (ref 12.0–46.0)
MCHC: 33.8 g/dL (ref 30.0–36.0)
MCV: 93.4 fl (ref 78.0–100.0)
MONOS PCT: 11.8 % (ref 3.0–12.0)
Monocytes Absolute: 0.7 10*3/uL (ref 0.1–1.0)
NEUTROS ABS: 3.3 10*3/uL (ref 1.4–7.7)
NEUTROS PCT: 54.3 % (ref 43.0–77.0)
PLATELETS: 373 10*3/uL (ref 150.0–400.0)
RBC: 4.19 Mil/uL — ABNORMAL LOW (ref 4.22–5.81)
RDW: 13.1 % (ref 11.5–15.5)
WBC: 6.1 10*3/uL (ref 4.0–10.5)

## 2018-05-06 LAB — FERRITIN: Ferritin: 14.5 ng/mL — ABNORMAL LOW (ref 22.0–322.0)

## 2018-05-06 LAB — SEDIMENTATION RATE: Sed Rate: 17 mm/hr (ref 0–20)

## 2018-05-08 ENCOUNTER — Encounter

## 2018-05-08 ENCOUNTER — Ambulatory Visit (INDEPENDENT_AMBULATORY_CARE_PROVIDER_SITE_OTHER): Payer: Medicare Other | Admitting: Internal Medicine

## 2018-05-08 ENCOUNTER — Encounter: Payer: Self-pay | Admitting: Internal Medicine

## 2018-05-08 DIAGNOSIS — D500 Iron deficiency anemia secondary to blood loss (chronic): Secondary | ICD-10-CM | POA: Diagnosis not present

## 2018-05-08 DIAGNOSIS — K50018 Crohn's disease of small intestine with other complication: Secondary | ICD-10-CM | POA: Diagnosis not present

## 2018-05-08 MED ORDER — PREDNISONE 10 MG PO TABS: ORAL_TABLET | ORAL | 0 refills | Status: DC

## 2018-05-08 NOTE — Assessment & Plan Note (Addendum)
Stable overall.  He seems to respond very quickly the prednisone which makes me think maybe he has some sort of adhesive process.  Given his age and overall situation I think current treatment with low fiber diet and as needed prednisone makes sense.

## 2018-05-08 NOTE — Assessment & Plan Note (Addendum)
Time for ferhame, he is not quite ready he will let me know he wants to look at his finances before proceeding.

## 2018-05-08 NOTE — Patient Instructions (Addendum)
We have sent the following medications to your pharmacy for you to pick up at your convenience: Prednisone   Call us back when you want to do more iron infusions.   Follow up with Dr. Carlean Purl in a year or sooner if needed.   I appreciate the opportunity to care for you. Silvano Rusk, MD, [facility]

## 2018-05-08 NOTE — Progress Notes (Signed)
As above       Raymond Rubio 83 y.o. 1935/12/17 [id number]  Assessment & Plan:  Crohn's disease of small intestine ([facility]) Stable overall.  He seems to respond very quickly the prednisone which makes me think maybe he has some sort of adhesive process.  Given his age and overall situation I think current treatment with low fiber diet and as needed prednisone makes sense.   Iron deficiency anemia due to chronic blood loss Time for ferhame, he is not quite ready he will let me know he wants to look at his finances before proceeding.  anticipate routine follow-up in 1 year sooner as needed  Subjective:   Chief Complaint: Follow-up of Crohn's disease and iron deficiency anemia  HPI the patient is here for follow-up, he has Crohn's disease and chronic blood loss anemia. He is not on chronic therapy by choice.  He has used prednisone for a couple to few weeks at a time 3 times in the last year I would like a refill on prednisone.  He has been treated with Feraheme for iron deficiency anemia with success in the past.  Hemoglobin 13.2 this week, ferritin is 14.5.  He has been under some stress unsure of stability of his pension and retirement.  No significant symptoms at this time but he will get pain and bloating and distention and within a day or 2 he will feel better after taking prednisone. Allergies  Allergen Reactions  . Ketamine Other (See Comments)    Causes hypotension and psychotic recovery    Current Meds  Medication Sig  . alfuzosin (UROXATRAL) 10 MG 24 hr tablet Take 10 mg by mouth every evening.   Marland Kitchen aspirin 81 MG tablet Take 81 mg by mouth daily.  . B Complex CAPS Take 1 capsule by mouth daily.  . Biotin 5000 MCG CAPS Take 1 capsule by mouth daily.  . Calcium Carb-Cholecalciferol (CALCIUM 600+D) 600-800 MG-UNIT TABS Take 1 tablet by mouth daily.  . Cholecalciferol (VITAMIN D3) 5000 UNITS CAPS Take 1 capsule by mouth daily.  Marland Kitchen dutasteride (AVODART) 0.5 MG capsule Take 0.5 mg  by mouth every evening.   . hyoscyamine (LEVSIN SL) 0.125 MG SL tablet Place 1 tablet (0.125 mg total) under the tongue every 4 (four) hours as needed.  . loperamide (IMODIUM) 2 MG capsule Take 1 mg by mouth as needed for diarrhea or loose stools.  Marland Kitchen loratadine (CLARITIN) 10 MG tablet Take 10 mg by mouth daily as needed for allergies.  . Multiple Vitamins-Minerals (CENTRUM ADULTS PO) Take 1 tablet by mouth daily.   Marland Kitchen sulfamethoxazole-trimethoprim (BACTRIM DS,SEPTRA DS) 800-160 MG tablet Take 1 tablet by mouth 2 (two) times daily.  . vitamin E 400 UNIT capsule Take 400 Units by mouth daily.  . Zinc 50 MG TABS Take 1 tablet by mouth daily.   Past Medical History:  Diagnosis Date  . Arthritis   . Benign schwannoma    per pt lumbar region  . Bladder calculi   . BPH with elevated PSA   . Chronic diarrhea    crohn's  . Complication of anesthesia    per pt surgery aborted in 1990's when doing ankle block, when given ketamine causes hypotension and psychotic recovery   . Crohn's disease of small intestine ([place]) followed by dr Carlean Purl   per pt dx 1972  . ED (erectile dysfunction)   . Erectile dysfunction due to arterial insufficiency   . Fibromyalgia   . History of bladder stone   .  History of cerebral embolic infarction 44/92/0100 left hemiparesis resolved   superior sagittal sinus thrombosis /  right ICH  posterior parietal hematoma /  high left frontal ischemic infarct  . 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 History of seizure 12/ 05/ 2005   x2 in setting of right ICH and Left frontal ischemic infarct  . Hypercholesterolemia   . Hypogonadism, testicular   . Iron deficiency anemia due to chronic blood loss    followed by dr Carlean Purl--  treatment IV Iron-- last infusion 10/ 2017  . Left acoustic neuroma ([place])   . Meckel's diverticulum    small instestine   Past  Surgical History:  Procedure Laterality Date  . CYSTO/  BLADDER STONE EXTRACTION  2006  ?  Marland Kitchen CYSTOSCOPY WITH LITHOLAPAXY N/A 04/30/2017   Procedure: CYSTOSCOPY WITH LITHOLAPAXY;  Surgeon: Franchot Gallo, MD;  Location: [facility];  Service: Urology;  Laterality: N/A;  . HIP FRACTURE SURGERY Right 05/11/2004   femoral pinning  . HOLMIUM LASER APPLICATION N/A 06/24/2196   Procedure: HOLMIUM LASER APPLICATION;  Surgeon: Franchot Gallo, MD;  Location: [facility];  Service: Urology;  Laterality: N/A;  . TONSILLECTOMY AND ADENOIDECTOMY  child   Social History   Social History Narrative   Patient is married and retired   Former smoker no alcohol tobacco or drug use at this time   family history includes Diverticulitis in his brother; Kidney Stones in his brother.   Review of Systems   Objective:   Physical Exam BP 126/84   Pulse 100   Ht 5' 8"  (1.727 m)   Wt 170 lb 6.4 oz (77.3 kg)   BMI 25.91 kg/m  Eyes anicteric Lungs clr ant Cor s1s2 abd soft NT, some hi pitched BS

## 2018-05-12 ENCOUNTER — Encounter: Payer: Self-pay | Admitting: Internal Medicine

## 2018-05-13 ENCOUNTER — Encounter: Payer: Self-pay | Admitting: Internal Medicine

## 2018-05-13 NOTE — Progress Notes (Signed)
Reviewed at office visit He will let me know when he wants to repeat feraheme

## 2018-05-31 ENCOUNTER — Telehealth: Payer: Self-pay

## 2018-05-31 ENCOUNTER — Telehealth: Payer: Self-pay | Admitting: Internal Medicine

## 2018-05-31 NOTE — Telephone Encounter (Signed)
Raymond Rubio, Recommend against using prednisone and Enterocort at the same time, I don't think it will add much to the prednisone. Further, I don't think he would get a significant benefit from higher dosing of oral prednisone more than 33m. Sounds like 418mprednisone has provided some benefit thus far. He should continue 406m day dosing, and would recommend he go to a low residual diet, and specifically a liquid diet for a day or 2 to see if that helps. Going into the weekend if he has vomiting, inability to tolerate PO, worsening pain, etc, he will need to go to the ED for imaging. Recommend he follow up with us [place] if he is able to.

## 2018-05-31 NOTE — Telephone Encounter (Signed)
To DOD, this is a patient of Dr Carlean Purl who reports a 3 day flare of his Crohns disease with pain, some gas, some vomiting, decreased bowel movements. He is currently taking 40 mg of Prednisone daily with some decrease in symptoms.  Dr Celesta Aver first available is 08/05/18.  I did offer him an appointment next week with an APP which he declined.  He is asking if he can take Entocort with the prednisone but I told him that would be double dipping on steroids but he wanted the response from a physician.  Then he wanted to know if the prednisone dose could be increased. He states that he will go to the ED if his symptoms increase.  Please advise.  Thank you.

## 2018-05-31 NOTE — Telephone Encounter (Signed)
I spoke with the patients wife, Sharyn Lull and conveyed to her the physicians recommendation.  She verbalized understanding of all recommendation.  I offered an appointment for next week with an APP and she adamantly refused.

## 2018-06-03 ENCOUNTER — Telehealth: Payer: Self-pay | Admitting: Internal Medicine

## 2018-06-03 NOTE — Telephone Encounter (Signed)
Pt having a crohn's flare up is requesting to see Dr. Carlean Purl asap. He refused appt with AP. Pls call him.

## 2018-06-03 NOTE — Telephone Encounter (Signed)
See phone notes from last week.  Patient encouraged to come in and see Ellouise Newer, PA or another PA this week.  He adamantly declines to see APP.  He wants to only see Dr. Carlean Purl.  He wants to wait until 08/05/18 to see Dr. Carlean Purl.  He understands that he should come in and be seen prior to this.  He states that he knows how to taper his prednisone and will wait to see him.

## 2018-06-11 ENCOUNTER — Other Ambulatory Visit: Payer: Self-pay | Admitting: Internal Medicine

## 2018-06-11 MED ORDER — PREDNISONE 10 MG PO TABS: ORAL_TABLET | ORAL | 0 refills | Status: DC

## 2018-08-05 ENCOUNTER — Ambulatory Visit: Payer: Medicare Other | Admitting: Internal Medicine

## 2018-09-23 ENCOUNTER — Telehealth: Payer: Self-pay | Admitting: Internal Medicine

## 2018-09-23 NOTE — Telephone Encounter (Signed)
Patient advised will decide on labs when he is seen on Friday

## 2018-09-23 NOTE — Telephone Encounter (Signed)
Pt has appt with Dr. Carlean Purl this coming Friday and wants to know if he needs labs before his appt. Pls call him

## 2018-09-27 ENCOUNTER — Encounter: Payer: Self-pay | Admitting: Internal Medicine

## 2018-09-27 ENCOUNTER — Other Ambulatory Visit (INDEPENDENT_AMBULATORY_CARE_PROVIDER_SITE_OTHER): Payer: Medicare Other

## 2018-09-27 ENCOUNTER — Ambulatory Visit (INDEPENDENT_AMBULATORY_CARE_PROVIDER_SITE_OTHER): Payer: Medicare Other | Admitting: Internal Medicine

## 2018-09-27 DIAGNOSIS — K50012 Crohn's disease of small intestine with intestinal obstruction: Secondary | ICD-10-CM | POA: Diagnosis not present

## 2018-09-27 DIAGNOSIS — N200 Calculus of kidney: Secondary | ICD-10-CM | POA: Diagnosis not present

## 2018-09-27 DIAGNOSIS — R82992 Hyperoxaluria: Secondary | ICD-10-CM | POA: Diagnosis not present

## 2018-09-27 DIAGNOSIS — D500 Iron deficiency anemia secondary to blood loss (chronic): Secondary | ICD-10-CM

## 2018-09-27 HISTORY — DX: Hyperoxaluria: R82.992

## 2018-09-27 LAB — CBC WITH DIFFERENTIAL/PLATELET
BASOS PCT: 0.5 % (ref 0.0–3.0)
Basophils Absolute: 0 10*3/uL (ref 0.0–0.1)
Eosinophils Absolute: 0.1 10*3/uL (ref 0.0–0.7)
Eosinophils Relative: 1.3 % (ref 0.0–5.0)
HEMATOCRIT: 37.3 % — AB (ref 39.0–52.0)
Hemoglobin: 12.5 g/dL — ABNORMAL LOW (ref 13.0–17.0)
LYMPHS ABS: 1.4 10*3/uL (ref 0.7–4.0)
LYMPHS PCT: 18.2 % (ref 12.0–46.0)
MCHC: 33.5 g/dL (ref 30.0–36.0)
MCV: 94.1 fl (ref 78.0–100.0)
MONOS PCT: 7.4 % (ref 3.0–12.0)
Monocytes Absolute: 0.6 10*3/uL (ref 0.1–1.0)
NEUTROS PCT: 72.6 % (ref 43.0–77.0)
Neutro Abs: 5.4 10*3/uL (ref 1.4–7.7)
PLATELETS: 369 10*3/uL (ref 150.0–400.0)
RBC: 3.97 Mil/uL — ABNORMAL LOW (ref 4.22–5.81)
RDW: 13 % (ref 11.5–15.5)
WBC: 7.5 10*3/uL (ref 4.0–10.5)

## 2018-09-27 LAB — COMPREHENSIVE METABOLIC PANEL
ALK PHOS: 64 U/L (ref 39–117)
ALT: 11 U/L (ref 0–53)
AST: 15 U/L (ref 0–37)
Albumin: 4 g/dL (ref 3.5–5.2)
BILIRUBIN TOTAL: 0.5 mg/dL (ref 0.2–1.2)
BUN: 19 mg/dL (ref 6–23)
CO2: 30 meq/L (ref 19–32)
Calcium: 10 mg/dL (ref 8.4–10.5)
Chloride: 106 mEq/L (ref 96–112)
Creatinine, Ser: 0.92 mg/dL (ref 0.40–1.50)
GFR: 83.92 mL/min (ref >60.00)
Glucose, Bld: 115 mg/dL — ABNORMAL HIGH (ref 70–99)
POTASSIUM: 4.1 meq/L (ref 3.5–5.1)
Sodium: 142 mEq/L (ref 135–145)
TOTAL PROTEIN: 6.8 g/dL (ref 6.0–8.3)

## 2018-09-27 LAB — FERRITIN: Ferritin: 22.1 ng/mL (ref 22.0–322.0)

## 2018-09-27 NOTE — Assessment & Plan Note (Signed)
Related to Crohn's disease - aauminghe has this which makes sense given recurrent stones. ? Tx - await Ct-E results

## 2018-09-27 NOTE — Progress Notes (Signed)
Raymond Rubio 83 y.o. 1935/01/28 [id number]  Assessment & Plan:  Crohn's disease of small intestine ([place]) CT-Enterography to evaluate for small bowel stricture/ingflammation. ? If his episodes are SBO - adehesions vs Crohn's flare. The rapidity with which they resolve suggests adhesions.  He was asking about Prevalite (wife has) for diarrhea - if intestine looks patent this is reasonable to try - we did discuss possibilty of obstruction with this Rx. His wife has "plenty" and he could trial with hers.    Iron deficiency anemia due to chronic blood loss CBC, Ferritin  Nephrolithiasis Presumably due to enteric hyperoxaluria Not sure what can be done ? 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 ? Tx - await Ct-E results   Cc Lindaann Slough, MD   Subjective:   Chief Complaint:  HPI Took prednisone x 3 weeks in July  Had bloating, abd pain, distention and constipation that remitted in about 24 hrs  Just had 2 more kidney stones - removed from bladder.  Asking if he can take Prevalite for diarrhea - Dr. Chari Manning says fat malabsorbtion leads to stones.   Allergies  Allergen Reactions  . Ketamine Other (See Comments)    Causes hypotension and psychotic recovery    Current Meds  Medication Sig  . alfuzosin (UROXATRAL) 10 MG 24 hr tablet Take 10 mg by mouth every evening.   Marland Kitchen aspirin 81 MG tablet Take 81 mg by mouth daily.  . B Complex CAPS Take 1 capsule by mouth daily.  . Biotin 5000 MCG CAPS Take 1 capsule by mouth daily.  . Calcium Carb-Cholecalciferol (CALCIUM 600+D) 600-800 MG-UNIT TABS Take 1 tablet by mouth daily.  . Cholecalciferol (VITAMIN D3) 5000 UNITS CAPS Take 1 capsule by mouth daily.  Marland Kitchen dutasteride (AVODART) 0.5 MG capsule Take 0.5 mg by mouth every evening.   . hyoscyamine (LEVSIN SL) 0.125 MG SL tablet  Place 1 tablet (0.125 mg total) under the tongue every 4 (four) hours as needed.  . loperamide (IMODIUM) 2 MG capsule Take 1 mg by mouth as needed for diarrhea or loose stools.  Marland Kitchen loratadine (CLARITIN) 10 MG tablet Take 10 mg by mouth daily as needed for allergies.  . Multiple Vitamins-Minerals (CENTRUM ADULTS PO) Take 1 tablet by mouth daily.   . predniSONE (DELTASONE) 10 MG tablet 4 daily x 1 week, 3 daily x 1 week, 2 daily x 1 week then 1 daily x 1 week and stop When needed  . vitamin E 400 UNIT capsule Take 400 Units by mouth daily.  . Zinc 50 MG TABS Take 1 tablet by mouth daily.   Past Medical History:  Diagnosis Date  . Arthritis   . Benign schwannoma    per pt lumbar region  . Bladder calculi   . BPH with elevated PSA   . Chronic diarrhea    crohn's  . Complication of anesthesia    per pt surgery aborted in 1990's when doing ankle block, when given ketamine causes hypotension and psychotic recovery   . Crohn's disease of small intestine ([place]) followed by dr Carlean Purl   per pt dx 1972  . ED (erectile dysfunction)   . Enteric hyperoxaluria 09/27/2018  . Erectile dysfunction due to arterial insufficiency   . Fibromyalgia   . History of bladder stone   . History of cerebral embolic infarction 27/78/2423 left hemiparesis resolved   superior sagittal  sinus thrombosis /  right ICH  posterior parietal hematoma /  high left frontal ischemic infarct  . 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 History of seizure 12/ 05/ 2005   x2 in setting of right ICH and Left frontal ischemic infarct  . Hypercholesterolemia   . Hypogonadism, testicular   . Iron deficiency anemia due to chronic blood loss    followed by dr Carlean Purl--  treatment IV Iron-- last infusion 10/ 2017  . Left acoustic neuroma ([place])   . Meckel's diverticulum    small instestine   Past Surgical History:    Procedure Laterality Date  . CYSTO/  BLADDER STONE EXTRACTION  2006  ?, 2019  . CYSTOSCOPY WITH LITHOLAPAXY N/A 04/30/2017   Procedure: CYSTOSCOPY WITH LITHOLAPAXY;  Surgeon: Franchot Gallo, MD;  Location: [facility];  Service: Urology;  Laterality: N/A;  . HIP FRACTURE SURGERY Right 05/11/2004   femoral pinning  . HOLMIUM LASER APPLICATION N/A 03/30/8591   Procedure: HOLMIUM LASER APPLICATION;  Surgeon: Franchot Gallo, MD;  Location: [facility];  Service: Urology;  Laterality: N/A;  . TONSILLECTOMY AND ADENOIDECTOMY  child   Social History   Social History Narrative   Patient is married and retired   Former smoker no alcohol tobacco or drug use at this time   family history includes Diverticulitis in his brother; Kidney Stones in his brother.   Review of Systems  As above Objective:   Physical Exam @BP  134/80 (BP Location: Left Arm, Patient Position: Sitting, Cuff Size: Normal)   Pulse 100   Ht 5' 8"  (1.727 m)   Wt 170 lb (77.1 kg)   BMI 25.85 kg/m @  General:  NAD Eyes:   anicteric Lungs:  clear Heart::  S1S2 no rubs, murmurs or gallops Abdomen:  soft and nontender, BS+ Ext:   no edema, cyanosis or clubbing    Data Reviewed:   As above

## 2018-09-27 NOTE — Patient Instructions (Signed)
  Your provider has requested that you go to the basement level for lab work before leaving today. Press "B" on the elevator. The lab is located at the first door on the left as you exit the elevator.   You have been scheduled for a CT scan of the abdomen and pelvis at [facility] ([address]---this is in the same building as Press [facility]).   You are scheduled on 10/10/18 at 3:30pm. You should arrive at 2:00pm for your appointment time for registration and prepping. Please follow the written instructions below on the day of your exam:  WARNING: IF YOU ARE ALLERGIC TO IODINE/X-RAY DYE, PLEASE NOTIFY RADIOLOGY IMMEDIATELY AT [phone]! YOU WILL BE GIVEN A 13 HOUR PREMEDICATION PREP.  1) Do not eat anything after 11:30AM (4 hours prior to your test) but you may have liquids.  You may take any medications as prescribed with a small amount of water, if necessary. If you take any of the following medications: METFORMIN, GLUCOPHAGE, GLUCOVANCE, AVANDAMET, RIOMET, FORTAMET, ACTOPLUS MET, JANUMET, GLUMETZA or METAGLIP, you MAY be asked to HOLD this medication 48 hours AFTER the exam.  This test typically takes 30-45 minutes to complete.  If you have any questions regarding your exam or if you need to reschedule, you may call the CT department at [phone] between the hours of 8:00 am and 5:00 pm, Monday-Friday.  ________________________________________________________________________  I appreciate the opportunity to care for you. Silvano Rusk, MD, [facility]

## 2018-09-27 NOTE — Assessment & Plan Note (Signed)
CBC, Ferritin

## 2018-09-27 NOTE — Assessment & Plan Note (Signed)
Presumably due to enteric hyperoxaluria Not sure what can be done ? Increase in dietary calcium Await CT-E and will review options

## 2018-09-27 NOTE — Assessment & Plan Note (Addendum)
CT-Enterography to evaluate for small bowel stricture/ingflammation. ? If his episodes are SBO - adehesions vs Crohn's flare. The rapidity with which they resolve suggests adhesions.  He was asking about Prevalite (wife has) for diarrhea - if intestine looks patent this is reasonable to try - we did discuss possibilty of obstruction with this Rx. His wife has "plenty" and he could trial with hers.

## 2018-09-30 NOTE — Progress Notes (Signed)
My Chart note Labs essentially ok slighght anemia Ferritin low NL Will consider feraheme

## 2018-10-07 ENCOUNTER — Telehealth: Payer: Self-pay | Admitting: Internal Medicine

## 2018-10-07 NOTE — Telephone Encounter (Signed)
noted 

## 2018-10-09 ENCOUNTER — Inpatient Hospital Stay: Admission: RE | Admit: 2018-10-09 | Payer: Medicare Other | Source: Ambulatory Visit

## 2018-10-10 ENCOUNTER — Inpatient Hospital Stay: Admission: RE | Admit: 2018-10-10 | Payer: Medicare Other | Source: Ambulatory Visit

## 2019-01-21 ENCOUNTER — Other Ambulatory Visit: Payer: Self-pay

## 2019-01-21 MED ORDER — HYOSCYAMINE SULFATE 0.125 MG SL SUBL: 0.1250 mg | SUBLINGUAL_TABLET | SUBLINGUAL | 0 refills | Status: DC | PRN

## 2020-03-20 ENCOUNTER — Ambulatory Visit: Payer: Medicare Other | Attending: Internal Medicine

## 2020-03-20 DIAGNOSIS — Z23 Encounter for immunization: Secondary | ICD-10-CM

## 2020-03-20 NOTE — Progress Notes (Signed)
   Covid-19 Vaccination Clinic  Name:  Raymond Rubio    MRN: [id number] DOB: 17-Sep-1935  03/20/2020  [name] was observed post Covid-19 immunization for 15 minutes without incident. He was provided with Vaccine Information Sheet and instruction to access the V-Safe system.   [name] was instructed to call 911 with any severe reactions post vaccine: Marland Kitchen Difficulty breathing  . Swelling of face and throat  . A fast heartbeat  . A bad rash all over body  . Dizziness and weakness

## 2021-09-29 ENCOUNTER — Other Ambulatory Visit: Payer: Self-pay | Admitting: Orthopedic Surgery

## 2021-09-29 DIAGNOSIS — G8929 Other chronic pain: Secondary | ICD-10-CM

## 2021-09-29 DIAGNOSIS — M545 Low back pain, unspecified: Secondary | ICD-10-CM

## 2021-09-30 ENCOUNTER — Ambulatory Visit
Admission: RE | Admit: 2021-09-30 | Discharge: 2021-09-30 | Disposition: A | Discharge status: Home / Self Care | Payer: Self-pay | Source: Ambulatory Visit | Attending: Orthopedic Surgery | Admitting: Orthopedic Surgery

## 2021-09-30 ENCOUNTER — Other Ambulatory Visit: Payer: Self-pay | Admitting: Orthopedic Surgery

## 2021-09-30 DIAGNOSIS — G8929 Other chronic pain: Secondary | ICD-10-CM

## 2021-09-30 DIAGNOSIS — M545 Low back pain, unspecified: Secondary | ICD-10-CM

## 2021-10-03 ENCOUNTER — Inpatient Hospital Stay
Admission: RE | Admit: 2021-10-03 | Discharge: 2021-10-03 | Disposition: A | Discharge status: Home / Self Care | Payer: Medicare Other | Source: Ambulatory Visit | Attending: Orthopedic Surgery | Admitting: Orthopedic Surgery

## 2021-10-03 ENCOUNTER — Other Ambulatory Visit: Payer: Medicare Other

## 2021-10-03 NOTE — Discharge Instructions (Signed)

## 2021-10-10 ENCOUNTER — Inpatient Hospital Stay
Admission: RE | Admit: 2021-10-10 | Discharge: 2021-10-10 | Disposition: A | Discharge status: Home / Self Care | Payer: Medicare Other | Source: Ambulatory Visit | Attending: Orthopedic Surgery | Admitting: Orthopedic Surgery

## 2021-10-10 ENCOUNTER — Other Ambulatory Visit: Payer: Medicare Other

## 2021-10-10 NOTE — Discharge Instructions (Signed)

## 2021-10-14 ENCOUNTER — Other Ambulatory Visit: Payer: Self-pay

## 2021-10-14 ENCOUNTER — Ambulatory Visit
Admission: RE | Admit: 2021-10-14 | Discharge: 2021-10-14 | Disposition: A | Discharge status: Home / Self Care | Payer: Medicare Other | Source: Ambulatory Visit | Attending: Orthopedic Surgery | Admitting: Orthopedic Surgery

## 2021-10-14 DIAGNOSIS — G8929 Other chronic pain: Secondary | ICD-10-CM

## 2021-10-14 DIAGNOSIS — M545 Low back pain, unspecified: Secondary | ICD-10-CM

## 2021-10-14 MED ORDER — MEPERIDINE HCL 50 MG/ML IJ SOLN: 50.0000 mg | Freq: Once | INTRAMUSCULAR | Status: DC | PRN

## 2021-10-14 MED ORDER — DIAZEPAM 5 MG PO TABS: 5.0000 mg | ORAL_TABLET | Freq: Once | ORAL | Status: DC

## 2021-10-14 MED ORDER — IOPAMIDOL (ISOVUE-M 200) INJECTION 41%
15.0000 mL | Freq: Once | INTRAMUSCULAR | Status: AC
  Administered 2021-10-14: 15 mL via INTRATHECAL

## 2021-10-14 MED ORDER — ONDANSETRON HCL 4 MG/2ML IJ SOLN: 4.0000 mg | Freq: Once | INTRAMUSCULAR | Status: DC | PRN

## 2021-10-14 NOTE — Discharge Instructions (Signed)

## 2021-10-14 NOTE — Progress Notes (Signed)
Valium not given prior to procedure. Home dose of Tizanidine taken prior to arrival.

## 2022-03-21 ENCOUNTER — Telehealth: Payer: Self-pay | Admitting: Internal Medicine

## 2022-03-21 NOTE — Telephone Encounter (Signed)
Patient called to schedule an OV with Dr. Carlean Purl. Patient was not satisfied waiting until 05/17/22 to see him. I offered to schedule with [employer] to ensure a sooner appointment, patient declined. I also offered to leave a message for Dr. Celesta Aver nurse to follow up with this issue and patient declined.  ? ?Patient is now requesting a transfer of care to [facility]. Patient stated he wanted to speak directly to Dr. Carlean Purl or RN to discuss frustration with our care.  ? ?I transferred call to medical records per patient request.  ? ?Please advise  ?

## 2022-03-21 NOTE — Telephone Encounter (Signed)
Pt stated that he is currently having a Chron's flare and is requesting an office visit: ?Pt was scheduled for 03/23/2022 at 10:10 AM: Pt made aware: ?Pt verbalized understanding with all questions answered.  ?

## 2022-03-23 ENCOUNTER — Ambulatory Visit (INDEPENDENT_AMBULATORY_CARE_PROVIDER_SITE_OTHER): Payer: Medicare Other | Admitting: Internal Medicine

## 2022-03-23 ENCOUNTER — Encounter: Payer: Self-pay | Admitting: Internal Medicine

## 2022-03-23 ENCOUNTER — Other Ambulatory Visit (INDEPENDENT_AMBULATORY_CARE_PROVIDER_SITE_OTHER): Payer: Medicare Other

## 2022-03-23 VITALS — BP 120/80 | HR 66 | Ht 70.0 in | Wt 155.0 lb

## 2022-03-23 DIAGNOSIS — K50012 Crohn's disease of small intestine with intestinal obstruction: Secondary | ICD-10-CM | POA: Diagnosis not present

## 2022-03-23 DIAGNOSIS — D500 Iron deficiency anemia secondary to blood loss (chronic): Secondary | ICD-10-CM

## 2022-03-23 LAB — C-REACTIVE PROTEIN: CRP: 2.3 mg/dL (ref 0.5–20.0)

## 2022-03-23 LAB — COMPREHENSIVE METABOLIC PANEL
ALT: 16 U/L (ref 0–53)
AST: 15 U/L (ref 0–37)
Albumin: 4.1 g/dL (ref 3.5–5.2)
Alkaline Phosphatase: 68 U/L (ref 39–117)
BUN: 22 mg/dL (ref 6–23)
CO2: 27 mEq/L (ref 19–32)
Calcium: 9.9 mg/dL (ref 8.4–10.5)
Chloride: 100 mEq/L (ref 96–112)
Creatinine, Ser: 0.93 mg/dL (ref 0.40–1.50)
GFR: 75.4 mL/min (ref >60.00)
Glucose, Bld: 111 mg/dL — ABNORMAL HIGH (ref 70–99)
Potassium: 3.8 mEq/L (ref 3.5–5.1)
Sodium: 137 mEq/L (ref 135–145)
Total Bilirubin: 0.6 mg/dL (ref 0.2–1.2)
Total Protein: 7 g/dL (ref 6.0–8.3)

## 2022-03-23 LAB — CBC WITH DIFFERENTIAL/PLATELET
Basophils Absolute: 0 10*3/uL (ref 0.0–0.1)
Basophils Relative: 0.3 % (ref 0.0–3.0)
Eosinophils Absolute: 0.1 10*3/uL (ref 0.0–0.7)
Eosinophils Relative: 1.3 % (ref 0.0–5.0)
HCT: 37.8 % — ABNORMAL LOW (ref 39.0–52.0)
Hemoglobin: 12.5 g/dL — ABNORMAL LOW (ref 13.0–17.0)
Lymphocytes Relative: 11.3 % — ABNORMAL LOW (ref 12.0–46.0)
Lymphs Abs: 0.8 10*3/uL (ref 0.7–4.0)
MCHC: 33.1 g/dL (ref 30.0–36.0)
MCV: 94.5 fl (ref 78.0–100.0)
Monocytes Absolute: 0.9 10*3/uL (ref 0.1–1.0)
Monocytes Relative: 13.7 % — ABNORMAL HIGH (ref 3.0–12.0)
Neutro Abs: 5.1 10*3/uL (ref 1.4–7.7)
Neutrophils Relative %: 73.4 % (ref 43.0–77.0)
Platelets: 400 10*3/uL (ref 150.0–400.0)
RBC: 4 Mil/uL — ABNORMAL LOW (ref 4.22–5.81)
RDW: 13.8 % (ref 11.5–15.5)
WBC: 6.9 10*3/uL (ref 4.0–10.5)

## 2022-03-23 LAB — SEDIMENTATION RATE: Sed Rate: 31 mm/hr — ABNORMAL HIGH (ref 0–20)

## 2022-03-23 LAB — VITAMIN B12: Vitamin B-12: 275 pg/mL (ref 211–911)

## 2022-03-23 LAB — VITAMIN D 25 HYDROXY (VIT D DEFICIENCY, FRACTURES): VITD: 26.47 ng/mL — ABNORMAL LOW (ref 30.00–100.00)

## 2022-03-23 LAB — FERRITIN: Ferritin: 28.7 ng/mL (ref 22.0–322.0)

## 2022-03-23 MED ORDER — OSCIMIN 0.125 MG SL SUBL: 1.0000 | SUBLINGUAL_TABLET | Freq: Four times a day (QID) | SUBLINGUAL | 1 refills | Status: DC | PRN

## 2022-03-23 MED ORDER — PREDNISONE 10 MG PO TABS: ORAL_TABLET | ORAL | 0 refills | Status: DC

## 2022-03-23 NOTE — Progress Notes (Signed)
? ?WARRICK LLERA y.o. 05/11/1935 [id number] ? ?Assessment & Plan:  ? ?Encounter Diagnoses  ?Name Primary?  ? Crohn's disease of small intestine with intestinal obstruction ([place]) Yes  ? Iron deficiency anemia due to chronic blood loss   ? ?I will refill his medications.  He has had 2 flares last year and one this year so far.  It is atypical but he seems to know how to manage his flares quite well so I have not intervened otherwise.  They are not that common.  We will reassess objectively with a CT and labs as below.  Further plans pending that we have arranged a May follow-up visit. ? ?Meds ordered this encounter  ?Medications  ? Hyoscyamine Sulfate SL (OSCIMIN) 0.125 MG SUBL  ?  Sig: Place 1 tablet under the tongue 4 (four) times daily as needed.  ?  Dispense:  90 tablet  ?  Refill:  1  ? predniSONE (DELTASONE) 10 MG tablet  ?  Sig: Take 40 mg daily when flaring taper as advised by MD  ?  Dispense:  100 tablet  ?  Refill:  0  ? ?Orders Placed This Encounter  ?Procedures  ? CT ENTERO ABD/PELVIS W CONTAST  ? CBC with Differential/Platelet  ? Comprehensive metabolic panel  ? C-reactive protein  ? Sedimentation rate  ? Ferritin  ? Vitamin B12  ? Vitamin D (25 hydroxy)  ? ? ? ?Subjective:  ? ?Chief Complaint: Crohn's disease ? ?HPI ?87 year old white man with a long history of Crohn's disease of the small intestine who uses intermittent short-term steroid taper to control his problems for many years.  He will get recurrent obstructive-like symptoms.  His last imaging was in 2016 with a CT abdomen and pelvis and showed some distal ileal bowel wall thickening consistent with Crohn's disease.  I have not seen him since 2019 at which point we were going to do a CT enterography but then he had spinal stenosis issues and has had a lot of problems with that over time plus COVID intervened.  Now he is back having had an episode recently with bloating and distention and some diarrhea.  He treated with steroids but  they were "old and I think ineffective".  He is feeling better now. ? ?He switched from orthopedics to neurosurgery and has had some successful epidural injections and feels better regarding his back. ? ? ?Wt Readings from Last 3 Encounters:  ?03/23/22 155 lb (70.3 kg)  ?09/27/18 170 lb (77.1 kg)  ?05/08/18 170 lb 6.4 oz (77.3 kg)  ? ? ? ?Allergies  ?Allergen Reactions  ? Ketamine Other (See Comments)  ?  Causes hypotension and psychotic recovery   ? ?Current Meds  ?Medication Sig  ? alfuzosin (UROXATRAL) 10 MG 24 hr tablet Take 10 mg by mouth every evening.   ? aspirin 81 MG tablet Take 81 mg by mouth daily.  ? B Complex CAPS Take 1 capsule by mouth daily.  ? Biotin 5000 MCG CAPS Take 1 capsule by mouth daily.  ? Calcium Carb-Cholecalciferol 600-800 MG-UNIT TABS Take 1 tablet by mouth daily.  ? Cholecalciferol (VITAMIN D3) 5000 UNITS CAPS Take 1 capsule by mouth daily.  ? dutasteride (AVODART) 0.5 MG capsule Take 0.5 mg by mouth every evening.   ? hyoscyamine (LEVSIN SL) 0.125 MG SL tablet Place 1 tablet (0.125 mg total) under the tongue every 4 (four) hours as needed.  ? loperamide (IMODIUM) 2 MG capsule Take 1 mg by mouth as needed  for diarrhea or loose stools.  ? loratadine (CLARITIN) 10 MG tablet Take 10 mg by mouth daily as needed for allergies.  ? Multiple Vitamins-Minerals (CENTRUM ADULTS PO) Take 1 tablet by mouth daily.   ? vitamin E 400 UNIT capsule Take 400 Units by mouth daily.  ? Zinc 50 MG TABS Take 1 tablet by mouth daily.  ? ?Past Medical History:  ?Diagnosis Date  ? Arthritis   ? Benign schwannoma   ? per pt lumbar region  ? Bladder calculi   ? BPH with elevated PSA   ? Chronic diarrhea   ? crohn's  ? Complication of anesthesia   ? per pt surgery aborted in 1990's when doing ankle block, when given ketamine causes hypotension and psychotic recovery   ? Crohn's disease of small intestine ([place]) followed by dr Carlean Purl  ? per pt dx 1972  ? ED (erectile dysfunction)   ? Enteric hyperoxaluria 09/27/2018   ? Erectile dysfunction due to arterial insufficiency   ? Fibromyalgia   ? History of bladder stone   ? History of cerebral embolic infarction 00/92/3300 left hemiparesis resolved  ? superior sagittal sinus thrombosis /  right ICH  posterior parietal hematoma /  high left frontal ischemic infarct  ? 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---  thrombosis resolved  ? 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 History of seizure 12/ 05/ 2005  ? x2 in setting of right ICH and Left frontal ischemic infarct  ? Hypercholesterolemia   ? Hypogonadism, testicular   ? Iron deficiency anemia due to chronic blood loss   ? followed by dr Carlean Purl--  treatment IV Iron-- last infusion 10/ 2017  ? Left acoustic neuroma ([place])   ? Meckel's diverticulum   ? small instestine  ? ?Past Surgical History:  ?Procedure Laterality Date  ? CYSTO/  BLADDER STONE EXTRACTION  2006  ?, 2019  ? CYSTOSCOPY WITH LITHOLAPAXY N/A 04/30/2017  ? Procedure: CYSTOSCOPY WITH LITHOLAPAXY;  Surgeon: Franchot Gallo, MD;  Location: [facility];  Service: Urology;  Laterality: N/A;  ? HIP FRACTURE SURGERY Right 05/11/2004  ? femoral pinning  ? HOLMIUM LASER APPLICATION N/A 06/29/2262  ? Procedure: HOLMIUM LASER APPLICATION;  Surgeon: Franchot Gallo, MD;  Location: [facility];  Service: Urology;  Laterality: N/A;  ? TONSILLECTOMY AND ADENOIDECTOMY  child  ? ?Social History  ? ?Social History Narrative  ? Patient is married and retired  ? Former smoker no alcohol tobacco or drug use at this time  ? ?family history includes Diverticulitis in his brother; Kidney Stones in his brother. ? ? ?Review of Systems ?As above ? ?Objective:  ? Physical Exam ?BP 120/80   Pulse 66   Ht 5' 10"  (1.778 m)   Wt 155 lb (70.3 kg)   BMI 22.24 kg/m?  ?Elderly wm NAD ?Lungs cta ?Kyphotic ?Abd soft NT BS + no HSM/mass ?Alert and oriented x 3 ? ?

## 2022-03-23 NOTE — Patient Instructions (Addendum)
Your provider has requested that you go to the basement level for lab work before leaving today. Press "B" on the elevator. The lab is located at the first door on the left as you exit the elevator. ? ?Due to recent changes in healthcare laws, you may see the results of your imaging and laboratory studies on MyChart before your provider has had a chance to review them.  We understand that in some cases there may be results that are confusing or concerning to you. Not all laboratory results come back in the same time frame and the provider may be waiting for multiple results in order to interpret others.  Please give [place] 48 hours in order for your provider to thoroughly review all the results before contacting the office for clarification of your results.  ? ?We have sent  medications to your pharmacy for you to pick up at your convenience. ? ? ?You will be contacted by [facility] in the next 2 days to arrange a CT enterography.  The number on your caller ID will be [phone], please answer when they call.  If you have not heard from them in 2 days please call [phone] to schedule.   ? ? ?I appreciate the opportunity to care for you. ?Silvano Rusk, MD, [facility] ?

## 2022-03-24 ENCOUNTER — Other Ambulatory Visit: Payer: Self-pay

## 2022-03-24 DIAGNOSIS — E559 Vitamin D deficiency, unspecified: Secondary | ICD-10-CM

## 2022-03-24 DIAGNOSIS — D500 Iron deficiency anemia secondary to blood loss (chronic): Secondary | ICD-10-CM

## 2022-03-27 ENCOUNTER — Other Ambulatory Visit: Payer: Self-pay

## 2022-03-27 ENCOUNTER — Telehealth: Payer: Self-pay | Admitting: Internal Medicine

## 2022-03-27 ENCOUNTER — Encounter: Payer: Self-pay | Admitting: Internal Medicine

## 2022-03-27 DIAGNOSIS — E559 Vitamin D deficiency, unspecified: Secondary | ICD-10-CM

## 2022-03-27 DIAGNOSIS — E538 Deficiency of other specified B group vitamins: Secondary | ICD-10-CM

## 2022-03-27 DIAGNOSIS — D500 Iron deficiency anemia secondary to blood loss (chronic): Secondary | ICD-10-CM

## 2022-03-27 MED ORDER — VITAMIN D (ERGOCALCIFEROL) 1.25 MG (50000 UNIT) PO CAPS: 50000.0000 [IU] | ORAL_CAPSULE | ORAL | 0 refills | Status: DC

## 2022-03-27 MED ORDER — CYANOCOBALAMIN 500 MCG PO TABS: ORAL_TABLET | ORAL | 0 refills | Status: DC

## 2022-03-27 MED ORDER — FERROUS SULFATE 325 (65 FE) MG PO TBEC: DELAYED_RELEASE_TABLET | ORAL | Status: DC

## 2022-03-27 NOTE — Telephone Encounter (Signed)
Spoke with patient regarding prescription. He was able to pick up the original order, he just wanted to ensure that we knew which pharmacy it should go to for future references. New order cancelled.  ?

## 2022-03-27 NOTE — Telephone Encounter (Signed)
Inbound call from patient states the prescriptions sent today were sent to the incorrect pharmacy. The correct pharmacy is [facility] [employer] on [address] ?

## 2022-03-28 NOTE — Telephone Encounter (Signed)
Patient called wanted to advise that the medication was sent to the incorrect pharmacy and now they are calling him and he would like the script to be canceled. He does have the Vitamin D. He would also like to confirm that the other two medications are for him to get OTC. ?

## 2022-03-28 NOTE — Telephone Encounter (Signed)
Pt stated that he did get his Vitamin D tablets prescription filled:  ?Pt confirmed that the other two medications that Dr. Jeanne Ivan wants him to start is OTC ?Pt verbalized understanding with all questions answered.  ? ?

## 2022-03-30 ENCOUNTER — Ambulatory Visit (HOSPITAL_COMMUNITY)
Admission: RE | Admit: 2022-03-30 | Discharge: 2022-03-30 | Disposition: A | Discharge status: Home / Self Care | Payer: Medicare Other | Source: Ambulatory Visit | Attending: Internal Medicine | Admitting: Internal Medicine

## 2022-03-30 DIAGNOSIS — K50012 Crohn's disease of small intestine with intestinal obstruction: Secondary | ICD-10-CM | POA: Diagnosis present

## 2022-03-30 MED ORDER — IOHEXOL 300 MG/ML  SOLN
100.0000 mL | Freq: Once | INTRAMUSCULAR | Status: AC | PRN
  Administered 2022-03-30: 100 mL via INTRAVENOUS

## 2022-03-30 MED ORDER — BARIUM SULFATE 0.1 % PO SUSP
ORAL | Status: AC
  Filled 2022-03-30: qty 3

## 2022-03-30 MED ORDER — SODIUM CHLORIDE (PF) 0.9 % IJ SOLN
INTRAMUSCULAR | Status: AC
  Filled 2022-03-30: qty 50

## 2022-04-25 ENCOUNTER — Encounter: Payer: Self-pay | Admitting: Internal Medicine

## 2022-04-25 ENCOUNTER — Ambulatory Visit (INDEPENDENT_AMBULATORY_CARE_PROVIDER_SITE_OTHER): Payer: Medicare Other | Admitting: Internal Medicine

## 2022-04-25 VITALS — BP 144/68 | HR 66 | Ht 68.0 in | Wt 159.0 lb

## 2022-04-25 DIAGNOSIS — D500 Iron deficiency anemia secondary to blood loss (chronic): Secondary | ICD-10-CM

## 2022-04-25 DIAGNOSIS — K50012 Crohn's disease of small intestine with intestinal obstruction: Secondary | ICD-10-CM | POA: Diagnosis not present

## 2022-04-25 DIAGNOSIS — E538 Deficiency of other specified B group vitamins: Secondary | ICD-10-CM | POA: Diagnosis not present

## 2022-04-25 DIAGNOSIS — K449 Diaphragmatic hernia without obstruction or gangrene: Secondary | ICD-10-CM

## 2022-04-25 DIAGNOSIS — E559 Vitamin D deficiency, unspecified: Secondary | ICD-10-CM | POA: Diagnosis not present

## 2022-04-25 NOTE — Progress Notes (Signed)
? ?Raymond Rubio y.o. 1935/07/25 [id number] ? ?Assessment & Plan:  ? ?Encounter Diagnoses  ?Name Primary?  ? Crohn's disease of small intestine with intestinal obstruction ([place]) Yes  ? Vitamin D deficiency   ? Vitamin B 12 deficiency   ? Iron deficiency anemia due to chronic blood loss   ? Hiatal hernia   ? ? ?We talked about how using Biologics is an option, neither he nor I think that makes sense.  He has managed things over the years with a few short courses of prednisone each year.  While that might not be ideal in the eyes of expert guidelines it has worked well for him and his preference is to continue to do that and I support that.  I have asked him to let me know if he feels like he needs treatment again.  He has prednisone at home to be used.  He did note that he felt a little more fatigued after his last taper.  I think that is multifactorial and may not be adrenal insufficiency as he wonders. ? ?He will continue his vitamin D supplementation and when he is done his 50,000 units weekly he will come for a level.  He does take 5000 units daily and will continue that.  I suspect there is a component of malabsorption here. ? ? ?Continue B12 and iron supplementation ? ? ?Follow-up in a year or sooner as needed otherwise other follow-up will be arranged when I review the vitamin D levels. He asked about COVID vaccination.  I told him I philosophy about that is is its reasonable in elderly people but that I do not make a hard recommendation to take it. I am not convinced his Crohn's disease puts him at a greater risk of problems even though he is technically immunosuppressed. ? ? ?Subjective:  ? ?Chief Complaint: Follow-up of Crohn's disease and other problems ? ?HPI ?[name] returns for follow-up of Crohn's disease and vitamin D deficiency.  He is on 50,000 units of vitamin D weekly he did not take it for a couple of weeks but he is back on track.  His Crohn's disease is under control at this point  he feels well.  He has had obstructive symptoms off and on without vomiting.  He has some chronic diarrhea issues.  Over the years he has managed with intermittent short courses of prednisone.  I had him do CT enterography and it showed multiple areas of stricturing with mild dilation upstream in some areas of the small bowel, predominantly in the ileum.  This is reviewed with the patient today.  It also showed a new hiatal hernia.  In the past he has had some indigestion and GERD symptoms but not lately.  He also has B12 and iron deficiency and is on supplementation. ?Allergies  ?Allergen Reactions  ? Ketamine Other (See Comments)  ?  Causes hypotension and psychotic recovery   ? ?Current Meds  ?Medication Sig  ? alfuzosin (UROXATRAL) 10 MG 24 hr tablet Take 10 mg by mouth every evening.   ? aspirin 81 MG tablet Take 81 mg by mouth daily.  ? B Complex CAPS Take 1 capsule by mouth daily.  ? Biotin 5000 MCG CAPS Take 1 capsule by mouth daily.  ? Calcium Carb-Cholecalciferol 600-800 MG-UNIT TABS Take 1 tablet by mouth daily.  ? Cholecalciferol (VITAMIN D3) 5000 UNITS CAPS Take 1 capsule by mouth daily.  ? dutasteride (AVODART) 0.5 MG capsule Take 0.5 mg by mouth every  evening.   ? Hyoscyamine Sulfate SL (OSCIMIN) 0.125 MG SUBL Place 1 tablet under the tongue 4 (four) times daily as needed.  ? loperamide (IMODIUM) 2 MG capsule Take 1 mg by mouth as needed for diarrhea or loose stools.  ? loratadine (CLARITIN) 10 MG tablet Take 10 mg by mouth daily as needed for allergies.  ? Multiple Vitamins-Minerals (CENTRUM ADULTS PO) Take 1 tablet by mouth daily.   ? predniSONE (DELTASONE) 10 MG tablet Take 40 mg daily when flaring taper as advised by MD  ? vitamin B-12 (CYANOCOBALAMIN) 500 MCG tablet Take 1000 mcg daily  ? Vitamin D, Ergocalciferol, (DRISDOL) 1.25 MG (50000 UNIT) CAPS capsule Take 1 capsule (50,000 Units total) by mouth every 7 (seven) days.  ? vitamin E 400 UNIT capsule Take 400 Units by mouth daily.  ? Zinc 50  MG TABS Take 1 tablet by mouth daily.  ? [ ferrous sulfate 325 (65 FE) MG EC tablet Take one tablet daily  ? ?Past Medical History:  ?Diagnosis Date  ? Arthritis   ? Benign schwannoma   ? per pt lumbar region  ? Bladder calculi   ? BPH with elevated PSA   ? Chronic diarrhea   ? crohn's  ? Complication of anesthesia   ? per pt surgery aborted in 1990's when doing ankle block, when given ketamine causes hypotension and psychotic recovery   ? Crohn's disease of small intestine ([place]) followed by dr Carlean Purl  ? per pt dx 1972  ? ED (erectile dysfunction)   ? Enteric hyperoxaluria 09/27/2018  ? Erectile dysfunction due to arterial insufficiency   ? Fibromyalgia   ? History of bladder stone   ? History of cerebral embolic infarction 09/16/3006 left hemiparesis resolved  ? superior sagittal sinus thrombosis /  right ICH  posterior parietal hematoma /  high left frontal ischemic infarct  ? 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---  thrombosis resolved  ? 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 History of seizure 12/ 05/ 2005  ? x2 in setting of right ICH and Left frontal ischemic infarct  ? Hypercholesterolemia   ? Hypogonadism, testicular   ? Iron deficiency anemia due to chronic blood loss   ? followed by dr Carlean Purl--  treatment IV Iron-- last infusion 10/ 2017  ? Left acoustic neuroma ([place])   ? Meckel's diverticulum   ? small instestine  ? ?Past Surgical History:  ?Procedure Laterality Date  ? CYSTO/  BLADDER STONE EXTRACTION  2006  ?, 2019  ? CYSTOSCOPY WITH LITHOLAPAXY N/A 04/30/2017  ? Procedure: CYSTOSCOPY WITH LITHOLAPAXY;  Surgeon: Franchot Gallo, MD;  Location: [facility];  Service: Urology;  Laterality: N/A;  ? HIP FRACTURE SURGERY Right 05/11/2004  ? femoral pinning  ? HOLMIUM LASER APPLICATION N/A 05/26/2632  ? Procedure: HOLMIUM LASER APPLICATION;  Surgeon: Franchot Gallo, MD;  Location: [facility]  Service: Urology;   Laterality: N/A;  ? TONSILLECTOMY AND ADENOIDECTOMY  child  ? ?Social History  ? ?Social History Narrative  ? Patient is married and retired  ? Former smoker no alcohol tobacco or drug use at this time  ? ?family history includes Diverticulitis in his brother; Kidney Stones in his brother. ? ? ?Review of Systems ? ?See HPI ?Objective:  ? Physical Exam ?BP (!) 144/68   Pulse 66   Ht 5' 8"  (1.727 m)   Wt 159 lb (72.1 kg)   BMI 24.18 kg/m?  ?Elderly white man  no acute distress ?Abdomen is soft nontender no organomegaly or mass bowel sounds present ? ?

## 2022-04-25 NOTE — Patient Instructions (Signed)
Please come and have your vitamin D level checked the week after you finish your rx. ? ?I appreciate the opportunity to care for you. ?Silvano Rusk, MD, [facility] ?

## 2022-05-18 ENCOUNTER — Other Ambulatory Visit: Payer: Self-pay | Admitting: Internal Medicine

## 2022-06-29 ENCOUNTER — Encounter: Payer: Self-pay | Admitting: Internal Medicine

## 2022-06-29 NOTE — Telephone Encounter (Signed)
PT called back about the note he received on my chart. He's very confused on the question of how the medication was taken. Please reach back out to advise

## 2022-10-13 ENCOUNTER — Other Ambulatory Visit: Payer: Self-pay | Admitting: Internal Medicine

## 2023-03-28 ENCOUNTER — Other Ambulatory Visit: Payer: Self-pay | Admitting: Internal Medicine

## 2023-04-02 ENCOUNTER — Telehealth: Payer: Self-pay | Admitting: Internal Medicine

## 2023-04-02 MED ORDER — HYOSCYAMINE SULFATE 0.125 MG SL SUBL: SUBLINGUAL_TABLET | SUBLINGUAL | 1 refills | Status: DC

## 2023-04-02 NOTE — Telephone Encounter (Signed)
I called and cancelled the generic [facility] at [facility] [place] and re-sent the rx to [facility] as patient requested. I tried to reach him and the phone just rings-no voicemail.

## 2023-04-02 NOTE — Telephone Encounter (Signed)
Patient called stating that his Levsin prescription was [facility] but was suppose to be sent to [facility]. Please advise.

## 2023-05-04 ENCOUNTER — Other Ambulatory Visit (INDEPENDENT_AMBULATORY_CARE_PROVIDER_SITE_OTHER): Payer: Medicare Other

## 2023-05-04 ENCOUNTER — Other Ambulatory Visit: Payer: Self-pay

## 2023-05-04 DIAGNOSIS — E559 Vitamin D deficiency, unspecified: Secondary | ICD-10-CM

## 2023-05-04 LAB — VITAMIN D 25 HYDROXY (VIT D DEFICIENCY, FRACTURES): VITD: 23.47 ng/mL — ABNORMAL LOW (ref 30.00–100.00)

## 2023-05-14 ENCOUNTER — Other Ambulatory Visit: Payer: Self-pay

## 2023-05-14 DIAGNOSIS — E559 Vitamin D deficiency, unspecified: Secondary | ICD-10-CM

## 2023-05-14 MED ORDER — VITAMIN D (ERGOCALCIFEROL) 50000 UNITS PO CAPS: 50000.0000 [IU] | ORAL_CAPSULE | ORAL | 0 refills | Status: DC

## 2023-06-01 ENCOUNTER — Other Ambulatory Visit: Payer: Self-pay | Admitting: Internal Medicine

## 2023-07-23 ENCOUNTER — Telehealth: Payer: Self-pay | Admitting: Internal Medicine

## 2023-07-23 DIAGNOSIS — D500 Iron deficiency anemia secondary to blood loss (chronic): Secondary | ICD-10-CM

## 2023-07-23 DIAGNOSIS — K50012 Crohn's disease of small intestine with intestinal obstruction: Secondary | ICD-10-CM

## 2023-07-23 DIAGNOSIS — E538 Deficiency of other specified B group vitamins: Secondary | ICD-10-CM

## 2023-07-23 DIAGNOSIS — E559 Vitamin D deficiency, unspecified: Secondary | ICD-10-CM

## 2023-07-23 NOTE — Telephone Encounter (Signed)
Inbound call from patient requesting a call back to discuss requesting to have labs done. Please advise, thank you.

## 2023-07-23 NOTE — Telephone Encounter (Signed)
Pt stated that he has just finished his 2nd round of the High Dose Vitamin D prescription and questions if he should get a lab done to retest that. Pt states that he has also be having some increased fatigue recently along with frequent bouts of diarrhea ( 2-3 times a day) pt taking Imodium along with Levsin. Pt was wondering if this could be his Crohn"s flaring up. No pain, no nausea, no temp.  Pt questioning about having  sed rate  be drawn for the Diarrhea, along with a Blood count to question anemia in which pt stated that he has had in the past that required IV iron.  Pt stated that he had an appointment that was scheduled for 29 of August with Dr. Leone Payor that he had to reschedule because of a scheduling conflict and now scheduled for 10/17/2023 at 11:10 AM.  Pt was made aware that Dr. Leone Payor is out of the office and will not return until next week. Pt stated that this is not an emergency and that he will wait for Dr. Leone Payor recommendations: Please advise.

## 2023-07-29 NOTE — Telephone Encounter (Signed)
I have ordered labs that he may do this week

## 2023-07-30 NOTE — Telephone Encounter (Signed)
Left message for pt to call back  °

## 2023-07-30 NOTE — Telephone Encounter (Signed)
Pt made aware of Dr. Leone Payor recommendations: Labs have been placed in Epic. Pt made aware.  Pt verbalized understanding with all questions answered.

## 2023-08-09 ENCOUNTER — Ambulatory Visit (INDEPENDENT_AMBULATORY_CARE_PROVIDER_SITE_OTHER): Payer: Medicare Other

## 2023-08-09 ENCOUNTER — Other Ambulatory Visit: Payer: Self-pay

## 2023-08-09 ENCOUNTER — Telehealth: Payer: Self-pay | Admitting: Internal Medicine

## 2023-08-09 DIAGNOSIS — K50012 Crohn's disease of small intestine with intestinal obstruction: Secondary | ICD-10-CM | POA: Diagnosis not present

## 2023-08-09 DIAGNOSIS — E559 Vitamin D deficiency, unspecified: Secondary | ICD-10-CM

## 2023-08-09 DIAGNOSIS — E538 Deficiency of other specified B group vitamins: Secondary | ICD-10-CM | POA: Diagnosis not present

## 2023-08-09 DIAGNOSIS — D500 Iron deficiency anemia secondary to blood loss (chronic): Secondary | ICD-10-CM

## 2023-08-09 LAB — CBC WITH DIFFERENTIAL/PLATELET
Basophils Absolute: 0 10*3/uL (ref 0.0–0.1)
Basophils Relative: 0.5 % (ref 0.0–3.0)
Eosinophils Absolute: 0.2 10*3/uL (ref 0.0–0.7)
Eosinophils Relative: 2.7 % (ref 0.0–5.0)
HCT: 38.7 % — ABNORMAL LOW (ref 39.0–52.0)
Hemoglobin: 12.6 g/dL — ABNORMAL LOW (ref 13.0–17.0)
Lymphocytes Relative: 23 % (ref 12.0–46.0)
Lymphs Abs: 1.3 10*3/uL (ref 0.7–4.0)
MCHC: 32.5 g/dL (ref 30.0–36.0)
MCV: 95.4 fl (ref 78.0–100.0)
Monocytes Absolute: 0.5 10*3/uL (ref 0.1–1.0)
Monocytes Relative: 8.9 % (ref 3.0–12.0)
Neutro Abs: 3.6 10*3/uL (ref 1.4–7.7)
Neutrophils Relative %: 64.9 % (ref 43.0–77.0)
Platelets: 347 10*3/uL (ref 150.0–400.0)
RBC: 4.06 Mil/uL — ABNORMAL LOW (ref 4.22–5.81)
RDW: 13.7 % (ref 11.5–15.5)
WBC: 5.6 10*3/uL (ref 4.0–10.5)

## 2023-08-09 LAB — COMPREHENSIVE METABOLIC PANEL
ALT: 12 U/L (ref 0–53)
AST: 14 U/L (ref 0–37)
Albumin: 3.9 g/dL (ref 3.5–5.2)
Alkaline Phosphatase: 67 U/L (ref 39–117)
BUN: 16 mg/dL (ref 6–23)
CO2: 26 mEq/L (ref 19–32)
Calcium: 9.9 mg/dL (ref 8.4–10.5)
Chloride: 106 mEq/L (ref 96–112)
Creatinine, Ser: 0.8 mg/dL (ref 0.40–1.50)
GFR: 80.48 mL/min (ref >60.00)
Glucose, Bld: 111 mg/dL — ABNORMAL HIGH (ref 70–99)
Potassium: 3.9 mEq/L (ref 3.5–5.1)
Sodium: 140 mEq/L (ref 135–145)
Total Bilirubin: 0.8 mg/dL (ref 0.2–1.2)
Total Protein: 6.9 g/dL (ref 6.0–8.3)

## 2023-08-09 LAB — SEDIMENTATION RATE: Sed Rate: 41 mm/hr — ABNORMAL HIGH (ref 0–20)

## 2023-08-09 LAB — VITAMIN D 25 HYDROXY (VIT D DEFICIENCY, FRACTURES): VITD: 27.56 ng/mL — ABNORMAL LOW (ref 30.00–100.00)

## 2023-08-09 LAB — VITAMIN B12: Vitamin B-12: 316 pg/mL (ref 211–911)

## 2023-08-09 LAB — FERRITIN: Ferritin: 15.6 ng/mL — ABNORMAL LOW (ref 22.0–322.0)

## 2023-08-09 MED ORDER — VITAMIN D (ERGOCALCIFEROL) 50000 UNITS PO CAPS
50000.0000 [IU] | ORAL_CAPSULE | ORAL | 0 refills | Status: DC
Start: 2023-08-09 — End: 2024-03-24

## 2023-08-09 NOTE — Telephone Encounter (Signed)
Patient is returning someone's phone call and would like someone to please call him back today.  Please use his cell phone number.  Thank you.

## 2023-08-10 ENCOUNTER — Other Ambulatory Visit: Payer: Self-pay | Admitting: Internal Medicine

## 2023-08-10 ENCOUNTER — Telehealth: Payer: Self-pay | Admitting: Internal Medicine

## 2023-08-10 NOTE — Telephone Encounter (Signed)
Reviewed labs He is having some diarrhea c/w Crohn's fl;are  He has prednisone on hand and I hgave told him to start 40 mg every day and update me in several days  He cannot tolerate oral iron and is iron deficient  Will order Iron infusion -

## 2023-08-10 NOTE — Telephone Encounter (Signed)
Refer to lab result note 08/09/23.

## 2023-08-10 NOTE — Progress Notes (Signed)
Intolerant of oral iron and has iron def anemia

## 2023-08-13 ENCOUNTER — Other Ambulatory Visit: Payer: Self-pay | Admitting: Internal Medicine

## 2023-08-13 MED ORDER — PREDNISONE 10 MG PO TABS: ORAL_TABLET | ORAL | 0 refills | Status: DC

## 2023-08-13 NOTE — Telephone Encounter (Signed)
Pt was contacted for a symptom update. Pt stated that he started the 40 mg of the prednisone yesterday and has not had any diarrhea since then. Pt was notified that I will call him back on Wednesday for another  symptom update. Pt was notified that Dr. Leone Payor had ordered IV iron for him and that the Infusion center will contact him to set up an appointment.  Pt verbalized understanding with all questions answered.

## 2023-08-15 ENCOUNTER — Telehealth: Payer: Self-pay | Admitting: Pharmacy Technician

## 2023-08-15 ENCOUNTER — Telehealth: Payer: Self-pay

## 2023-08-15 NOTE — Telephone Encounter (Signed)
Auth Submission: NO AUTH NEEDED Site of care: Site of care: [facility] Payer: Medicare/BCBS Medication & CPT/J Code(s) submitted: Monoferric (Ferrci derisomaltose) [phone] Route of submission (phone, fax, portal): phone Phone # Fax # Auth type: Buy/Bill HB Units/visits requested: 1000mg , 1 dose Reference number: [id number] Approval from: 08/15/23 to 12/15/23

## 2023-08-15 NOTE — Addendum Note (Signed)
Addended by: Iva Boop on: 08/15/2023 03:54 PM   Modules accepted: Orders

## 2023-08-15 NOTE — Telephone Encounter (Signed)
Pt was contacted for a symptom update. Pt stated that he noticed on Monday that his Old Prednisone tablets were out of date. Expired April 2023. Pt stated that he did not take medication yesterday and pick up a new prescription yesterday. Pt stated that he is still having some diarrhea. Pt also added that he has been under some stress recently as his wife had a stroke 1 month ago but thankfully she has no deficients now. Pt was notified to continue taking the prednisone as prescribed by Dr Leone Payor and to give [place] a call back on Monday with a symptom update.  Pt verbalized understanding with all questions answered.

## 2023-08-15 NOTE — Telephone Encounter (Signed)
Dr. Leone Payor,  Monoferric tx plan is un-signed.  (Please sign orders).  Patient will be scheduled as soon as possible.  Auth Submission: NO AUTH NEEDED Site of care: Site of care: [facility] Payer: medicare a/b & bcbs tenn. Medication & CPT/J Code(s) submitted: Monoferric (Ferrci derisomaltose) [id number] Route of submission (phone, fax, portal):  Phone # Fax # Auth type: Buy/Bill PB Units/visits requested: x1 Reference number:  Approval from: 08/15/23 to 11/15/23

## 2023-08-20 ENCOUNTER — Encounter: Payer: Self-pay | Admitting: Internal Medicine

## 2023-08-20 NOTE — Telephone Encounter (Signed)
Spoke with Pt. Pt stated that he wanted to give Dr. Leone Payor an update. Pt stated that the diarrhea is doing better but has not ceased. Pt stated that he starting tapering down to 30 mg yesterday. Pt stated that he has an appointment for the iron infusion on 08/24/2023. Routed as an Financial planner

## 2023-08-23 ENCOUNTER — Ambulatory Visit: Payer: Medicare Other | Admitting: Internal Medicine

## 2023-08-24 ENCOUNTER — Ambulatory Visit: Payer: Medicare Other

## 2023-08-24 VITALS — BP 137/87 | HR 70 | Temp 98.4°F | Resp 18 | Ht 70.0 in | Wt 157.2 lb

## 2023-08-24 DIAGNOSIS — D500 Iron deficiency anemia secondary to blood loss (chronic): Secondary | ICD-10-CM

## 2023-08-24 DIAGNOSIS — K50018 Crohn's disease of small intestine with other complication: Secondary | ICD-10-CM

## 2023-08-24 MED ORDER — ACETAMINOPHEN 325 MG PO TABS
650.0000 mg | ORAL_TABLET | Freq: Once | ORAL | Status: AC
  Administered 2023-08-24: 650 mg via ORAL
  Filled 2023-08-24: qty 2

## 2023-08-24 MED ORDER — SODIUM CHLORIDE 0.9 % IV SOLN
1000.0000 mg | Freq: Once | INTRAVENOUS | Status: AC
  Administered 2023-08-24: 1000 mg via INTRAVENOUS
  Filled 2023-08-24: qty 0

## 2023-08-24 MED ORDER — DIPHENHYDRAMINE HCL 50 MG/ML IJ SOLN
25.0000 mg | Freq: Once | INTRAMUSCULAR | Status: AC
  Administered 2023-08-24: 25 mg via INTRAVENOUS
  Filled 2023-08-24: qty 1

## 2023-08-24 NOTE — Progress Notes (Signed)
Diagnosis: Iron Deficiency Anemia  Provider:  Chilton Greathouse MD  Procedure: IV Infusion  IV Type: Peripheral, IV Location: R Upper Arm  Monoferric (Ferric Derisomaltose), Dose: 1000 mg  Infusion Start Time: 1522  Infusion Stop Time: 1543  Post Infusion IV Care: Observation period completed and Peripheral IV Discontinued  Discharge: Condition: Stable, Destination: Home . AVS Declined  Performed by:  Wyvonne Lenz, RN

## 2023-09-21 ENCOUNTER — Other Ambulatory Visit: Payer: Self-pay | Admitting: Internal Medicine

## 2023-09-24 NOTE — Telephone Encounter (Signed)
Please get an update from him regarding signs and symptoms of his Crohn's and how much prednisone he is currently taking and verify with what he needs

## 2023-09-24 NOTE — Telephone Encounter (Signed)
He is dealing with chronic diarrhea 2-3 episodes daily, no blood, no fever, no abdominal pain. He said he has an appointment with Dr Leone Payor 10/17/2023. His current dose is 20mg  daily and he will be going to 10mg  daily tomorrow. He started at 40mg  and was to drop it by 10mg  after 4 days at the dosage. He is also on OTC Imodium and levsin SL and Vitamin D 50,000 international from Dr Leone Payor. He wants a fresh supply as the date on his has expired.

## 2023-09-24 NOTE — Telephone Encounter (Signed)
Please advise Sir, thank you. 

## 2023-10-17 ENCOUNTER — Encounter: Payer: Self-pay | Admitting: Internal Medicine

## 2023-10-17 ENCOUNTER — Other Ambulatory Visit (INDEPENDENT_AMBULATORY_CARE_PROVIDER_SITE_OTHER): Payer: Medicare Other

## 2023-10-17 ENCOUNTER — Ambulatory Visit: Payer: Medicare Other | Admitting: Internal Medicine

## 2023-10-17 ENCOUNTER — Other Ambulatory Visit: Payer: Self-pay | Admitting: Internal Medicine

## 2023-10-17 VITALS — BP 145/78 | HR 65 | Ht 70.0 in | Wt 162.0 lb

## 2023-10-17 DIAGNOSIS — R5382 Chronic fatigue, unspecified: Secondary | ICD-10-CM | POA: Diagnosis not present

## 2023-10-17 DIAGNOSIS — K529 Noninfective gastroenteritis and colitis, unspecified: Secondary | ICD-10-CM | POA: Diagnosis not present

## 2023-10-17 DIAGNOSIS — K50012 Crohn's disease of small intestine with intestinal obstruction: Secondary | ICD-10-CM

## 2023-10-17 DIAGNOSIS — D500 Iron deficiency anemia secondary to blood loss (chronic): Secondary | ICD-10-CM

## 2023-10-17 DIAGNOSIS — E559 Vitamin D deficiency, unspecified: Secondary | ICD-10-CM

## 2023-10-17 LAB — CBC WITH DIFFERENTIAL/PLATELET
Basophils Absolute: 0 10*3/uL (ref 0.0–0.1)
Basophils Relative: 0.1 % (ref 0.0–3.0)
Eosinophils Absolute: 0 10*3/uL (ref 0.0–0.7)
Eosinophils Relative: 0.1 % (ref 0.0–5.0)
HCT: 37.9 % — ABNORMAL LOW (ref 39.0–52.0)
Hemoglobin: 12.3 g/dL — ABNORMAL LOW (ref 13.0–17.0)
Lymphocytes Relative: 7.8 % — ABNORMAL LOW (ref 12.0–46.0)
Lymphs Abs: 0.6 10*3/uL — ABNORMAL LOW (ref 0.7–4.0)
MCHC: 32.6 g/dL (ref 30.0–36.0)
MCV: 97.2 fL (ref 78.0–100.0)
Monocytes Absolute: 0.2 10*3/uL (ref 0.1–1.0)
Monocytes Relative: 3.2 % (ref 3.0–12.0)
Neutro Abs: 6.7 10*3/uL (ref 1.4–7.7)
Neutrophils Relative %: 88.8 % — ABNORMAL HIGH (ref 43.0–77.0)
Platelets: 425 10*3/uL — ABNORMAL HIGH (ref 150.0–400.0)
RBC: 3.9 Mil/uL — ABNORMAL LOW (ref 4.22–5.81)
RDW: 14.7 % (ref 11.5–15.5)
WBC: 7.5 10*3/uL (ref 4.0–10.5)

## 2023-10-17 LAB — SEDIMENTATION RATE: Sed Rate: 23 mm/h — ABNORMAL HIGH (ref 0–20)

## 2023-10-17 LAB — VITAMIN D 25 HYDROXY (VIT D DEFICIENCY, FRACTURES): VITD: 39.71 ng/mL (ref 30.00–100.00)

## 2023-10-17 LAB — FERRITIN: Ferritin: 104.5 ng/mL (ref 22.0–322.0)

## 2023-10-17 LAB — TSH: TSH: 1.96 u[IU]/mL (ref 0.35–5.50)

## 2023-10-17 NOTE — Progress Notes (Addendum)
Raymond Rubio [id number] 88 y.o. [birth year] [id number]  Assessment & Plan:   Encounter Diagnoses  Name Primary?   Crohn's disease of small intestine with intestinal obstruction ([facility]) Yes   Iron deficiency anemia due to chronic blood loss    Vitamin D deficiency    Chronic diarrhea    Chronic fatigue        Chronic diarrhea managed with intermittent Prednisone and symptomatic relief with Imodium and Levsin. Discussed the potential need for chronic treatment with biologics or surgery if symptoms worsen, but patient prefers current management strategy. -Continue current management with Prednisone, Imodium, and Levsin. -Refill Prednisone prescription. -Patient to report his log of Prednisone use to me via MyChart.  Iron Deficiency Anemia Unsatisfactory experience with recent iron infusion. Persistent fatigue. Discussed potential referral to a hematologist if issues persist. -Check blood counts and iron levels today. -Consider referral to a hematologist if issues persist.  Vitamin D Deficiency Completed 7-week course of Vitamin D supplementation. -Check Vitamin D levels today.       I once again reviewed how we typically treat Crohn's disease with chronic medication such as Biologics or related medications that are immunosuppressive's.  He understands this but does not want to pursue it.  He wants to continue to use intermittent prednisone and he is aware of the side effects and risks of that as well.  He is aware that he could develop a situation requiring surgery.  Orders Placed This Encounter  Procedures   Ferritin   CBC with Differential/Platelet   TSH   Sedimentation rate   Vitamin D (25 hydroxy)   Patient mailed me a log of his prednisone use through October.  It actually begins in March 2023.  He took prednisone from March 18 through April 16, 2022; then June 25 through July 17, 2022; then 12/15/2022 through December 31, 2022; then 02/06/2023 through 02/22/2023; then  08/11/2023 through 09/09/2023; then September 16, 2023 through October 18, 2023.   Subjective:   Chief Complaint: Follow-up of Crohn's, fatigue, vitamin D deficiency and iron deficiency  HPI 88 year old white man with a history of Crohn's disease of the small bowel, B12 deficiency, iron deficiency anemia, and vitamin D deficiency.  He was last seen in May 2023.  He has used intermittent prednisone over the years to control his disease, self-guided treatment.  He had labs in August when he called in complaining of fatigue issues.  A ferritin was 15.6, vitamin D 27.56, sed rate 41, CMET normal except for glucose 111, hemoglobin 12.6 hematocrit 38 MCV 95 white count 5.6 and platelets 347.  Vitamin D was 23.47 in May 2024.  He was treated with 50,000 units weekly x 8 after that.  I reordered that same treatment after the vitamin D level in August that was 27.  He had difficulty with oral iron and constipation so was referred for iron infusion.  He has been having increasing diarrhea and was using prednisone again in August.  He did receive 1000 mg of Monoferric on August 24, 2023.     Wt Readings from Last 3 Encounters:  10/17/23 162 lb (73.5 kg)  08/24/23 157 lb 3.2 oz (71.3 kg)  04/25/22 159 lb (72.1 kg)     CT enterography in April 2023 as below: IMPRESSION: Multiple chronic strictures involving the terminal ileum and distal ileum, with moderate distal small bowel dilatation consistent with low-grade,partial obstruction. No imaging signs of active small bowel inflammation or penetrating disease.   Moderate hiatal hernia.   Colonic  diverticulosis, without radiographic evidence of diverticulitis.   Stable markedly enlarged prostate gland. Multiple small bladder calculi.   Aortic Atherosclerosis (ICD10-I70.0).   Allergies  Allergen Reactions   Ketamine Other (See Comments)    Causes hypotension and psychotic recovery    Current Meds  Medication Sig   alfuzosin (UROXATRAL) 10  MG 24 hr tablet Take 10 mg by mouth every evening.    aspirin 81 MG tablet Take 81 mg by mouth daily.   B Complex CAPS Take 1 capsule by mouth daily.   Biotin 5000 MCG CAPS Take 1 capsule by mouth daily.   Calcium Carb-Cholecalciferol 600-800 MG-UNIT TABS Take 1 tablet by mouth daily.   Cholecalciferol (VITAMIN D3) 5000 UNITS CAPS Take 1 capsule by mouth daily.   dutasteride (AVODART) 0.5 MG capsule Take 0.5 mg by mouth every evening.    hyoscyamine (LEVSIN SL) 0.125 MG SL tablet DISSOLVE 1 TABLET BY MOUTH 4 TIMES A DAY AS NEEDED   loperamide (IMODIUM) 2 MG capsule Take 1 mg by mouth as needed for diarrhea or loose stools.   loratadine (CLARITIN) 10 MG tablet Take 10 mg by mouth daily as needed for allergies.   Multiple Vitamins-Minerals (CENTRUM ADULTS PO) Take 1 tablet by mouth daily.    predniSONE (DELTASONE) 10 MG tablet TAKE 4 TABLETS (40MG ) DAILY WHEN FLARING. TAPER AS ADVISED BY MD   vitamin B-12 (CYANOCOBALAMIN) 500 MCG tablet Take 1000 mcg daily   Vitamin D, Ergocalciferol, (DRISDOL) 1.25 MG (50000 UNIT) CAPS capsule Take 1 capsule (50,000 Units total) by mouth every 7 (seven) days.   Vitamin D, Ergocalciferol, 50000 units CAPS Take 50,000 Units by mouth every 7 (seven) days.   vitamin E 400 UNIT capsule Take 400 Units by mouth daily.   Zinc 50 MG TABS Take 1 tablet by mouth daily.   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   superior sagittal sinus thrombosis /  right ICH  posterior parietal hematoma /  high left frontal ischemic infarct   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---  thrombosis resolved   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    History of seizure 12/ 05/ 2005   x2 in setting of right ICH and Left frontal ischemic infarct   Hypercholesterolemia    Hypogonadism, testicular    Iron deficiency anemia due to chronic blood loss    followed by dr Leone Payor--  treatment IV Iron-- last infusion 10/ 2017   Left acoustic neuroma ([facility])    Meckel's diverticulum    small instestine   Past Surgical History:  Procedure Laterality Date   CYSTO/  BLADDER STONE EXTRACTION  2006  ?, 2019   CYSTOSCOPY WITH LITHOLAPAXY N/A 04/30/2017   Procedure: CYSTOSCOPY WITH LITHOLAPAXY;  Surgeon: Marcine Matar, MD;  Location:  [facility];  Service: Urology;  Laterality: N/A;   HIP FRACTURE SURGERY Right 05/11/2004   femoral pinning   HOLMIUM LASER APPLICATION N/A 04/30/2017   Procedure: HOLMIUM LASER APPLICATION;  Surgeon: Marcine Matar, MD;  Location: [facility];  Service: Urology;  Laterality: N/A;   TONSILLECTOMY AND ADENOIDECTOMY  child   Social History   Social History Narrative   Patient is married and retired   Former smoker no alcohol tobacco or drug use at this time   family history includes Diverticulitis in his brother; Kidney Stones in his brother.   Review of Systems As per HPI  Objective:   Physical Exam BP (!) 160/80   Pulse 65   Ht 5\' 10"  (1.778 m)   Wt 162 lb (73.5 kg)   SpO2 100%   BMI 23.24 kg/m  Physical Exam   MEASUREMENTS: weight stable CHEST: lungs clear to auscultation CARDIOVASCULAR: heart sounds normal ABDOMEN: bowel sounds normal, no abdominal tenderness

## 2023-10-17 NOTE — Patient Instructions (Addendum)
VISIT SUMMARY:  During today's visit, we discussed your ongoing management of Crohn's disease, iron deficiency anemia, and vitamin D deficiency. You reported persistent fatigue despite a recent iron infusion and shared concerns about the long-term use of prednisone. We reviewed your current treatment plan and discussed potential future options.  YOUR PLAN:  -CROHN'S DISEASE: Crohn's disease is a chronic inflammatory condition of the gastrointestinal tract. We will continue your current management with Prednisone, Imodium, and Levsin. Please keep a log of your Prednisone use and communicate any changes via MyChart. We discussed the potential need for biologics or surgery if symptoms worsen, but you prefer to continue with the current strategy.  -IRON DEFICIENCY ANEMIA: Iron deficiency anemia occurs when your body lacks enough iron to produce adequate levels of hemoglobin, leading to fatigue and weakness. We will check your blood counts and iron levels today. If your symptoms persist, we may refer you to a hematologist for further evaluation.  -VITAMIN D DEFICIENCY: Vitamin D deficiency means your body has low levels of vitamin D, which is important for bone health and immune function. You have completed a course of Vitamin D supplementation, and we will check your Vitamin D levels today.  Fatigue issues could be from multiple factors. await lab results.    INSTRUCTIONS:  Please report your log of your Prednisone use in the past year via MyChart. I will notify about lab results and recommendations.   I appreciate the opportunity to care for you. Stan Head, MD, [facility]

## 2023-10-19 ENCOUNTER — Telehealth: Payer: Self-pay | Admitting: Internal Medicine

## 2023-10-19 NOTE — Telephone Encounter (Signed)
Inbound call from patient requesting a f/u call to discuss information needed from previous apt. Please advise.

## 2023-10-19 NOTE — Telephone Encounter (Signed)
Inbound call from patient, states he needs further explanation on message below, phone disconnected while trying to reach a nurse, called patient back and unable to leave message.

## 2023-10-19 NOTE — Telephone Encounter (Signed)
Patient was advised at time of OV to contact Dr. Leone Payor with his prednisone log. He uses an app called [employer] & is unsure how to transfer the information unless Dr. Leone Payor has this app. For now he will print out the pages that includes some dates and dosages, and mail it in to our office for MD to review.

## 2023-10-19 NOTE — Telephone Encounter (Signed)
Previously spoke with patient in regards to labs & recommendations. He has called back with further questions. States he reviewed his labs & he's concerned that his sed rate number has only decreased because of the prednisone, and that when he comes off (he's close to being done with the taper) that it will increase again and continue to have active inflammation & chronic diarrhea. He also would like to know if MD is recommending a hematology referral. Feels that his labs should be better than what they are considering he had an IV iron infusion in August.

## 2023-10-19 NOTE — Telephone Encounter (Signed)
Spoke to the patient.  He understands that he has chosen intermittent therapy and that his inflammation may fluctuate and does not want to pursue chronic treatment with Biologics or similar medications.  I explained that we should give it some time and I am not referring to hematology at this point, the ferritin is 100 his hemoglobin may be at baseline/peak status perhaps he has an element of chronic disease anemia.  We can regroup and recheck when he returns for follow-up.

## 2024-01-15 ENCOUNTER — Other Ambulatory Visit: Payer: Self-pay | Admitting: Internal Medicine

## 2024-01-15 NOTE — Telephone Encounter (Signed)
Please advise how many refills Sir, thank you. 

## 2024-01-16 ENCOUNTER — Ambulatory Visit: Payer: Medicare Other | Admitting: Internal Medicine

## 2024-03-24 ENCOUNTER — Encounter: Payer: Self-pay | Admitting: Internal Medicine

## 2024-03-24 ENCOUNTER — Other Ambulatory Visit (INDEPENDENT_AMBULATORY_CARE_PROVIDER_SITE_OTHER)

## 2024-03-24 ENCOUNTER — Ambulatory Visit (INDEPENDENT_AMBULATORY_CARE_PROVIDER_SITE_OTHER): Payer: Medicare Other | Admitting: Internal Medicine

## 2024-03-24 VITALS — BP 142/64 | HR 65 | Ht 71.0 in | Wt 160.0 lb

## 2024-03-24 DIAGNOSIS — D500 Iron deficiency anemia secondary to blood loss (chronic): Secondary | ICD-10-CM

## 2024-03-24 DIAGNOSIS — R5383 Other fatigue: Secondary | ICD-10-CM | POA: Diagnosis not present

## 2024-03-24 DIAGNOSIS — R011 Cardiac murmur, unspecified: Secondary | ICD-10-CM | POA: Diagnosis not present

## 2024-03-24 DIAGNOSIS — K50018 Crohn's disease of small intestine with other complication: Secondary | ICD-10-CM

## 2024-03-24 DIAGNOSIS — E559 Vitamin D deficiency, unspecified: Secondary | ICD-10-CM

## 2024-03-24 LAB — VITAMIN D 25 HYDROXY (VIT D DEFICIENCY, FRACTURES): VITD: 36.11 ng/mL (ref 30.00–100.00)

## 2024-03-24 LAB — CBC WITH DIFFERENTIAL/PLATELET
Basophils Absolute: 0 10*3/uL (ref 0.0–0.1)
Basophils Relative: 0.1 % (ref 0.0–3.0)
Eosinophils Absolute: 0 10*3/uL (ref 0.0–0.7)
Eosinophils Relative: 0 % (ref 0.0–5.0)
HCT: 39.1 % (ref 39.0–52.0)
Hemoglobin: 13 g/dL (ref 13.0–17.0)
Lymphocytes Relative: 5.1 % — ABNORMAL LOW (ref 12.0–46.0)
Lymphs Abs: 0.5 10*3/uL — ABNORMAL LOW (ref 0.7–4.0)
MCHC: 33.3 g/dL (ref 30.0–36.0)
MCV: 95.7 fl (ref 78.0–100.0)
Monocytes Absolute: 0.2 10*3/uL (ref 0.1–1.0)
Monocytes Relative: 2.4 % — ABNORMAL LOW (ref 3.0–12.0)
Neutro Abs: 9.2 10*3/uL — ABNORMAL HIGH (ref 1.4–7.7)
Neutrophils Relative %: 92.4 % — ABNORMAL HIGH (ref 43.0–77.0)
Platelets: 421 10*3/uL — ABNORMAL HIGH (ref 150.0–400.0)
RBC: 4.08 Mil/uL — ABNORMAL LOW (ref 4.22–5.81)
RDW: 13.4 % (ref 11.5–15.5)
WBC: 10 10*3/uL (ref 4.0–10.5)

## 2024-03-24 LAB — COMPREHENSIVE METABOLIC PANEL WITH GFR
ALT: 15 U/L (ref 0–53)
AST: 14 U/L (ref 0–37)
Albumin: 4.3 g/dL (ref 3.5–5.2)
Alkaline Phosphatase: 71 U/L (ref 39–117)
BUN: 19 mg/dL (ref 6–23)
CO2: 25 meq/L (ref 19–32)
Calcium: 10 mg/dL (ref 8.4–10.5)
Chloride: 103 meq/L (ref 96–112)
Creatinine, Ser: 0.81 mg/dL (ref 0.40–1.50)
GFR: 79.83 mL/min (ref >60.00)
Glucose, Bld: 140 mg/dL — ABNORMAL HIGH (ref 70–99)
Potassium: 3.7 meq/L (ref 3.5–5.1)
Sodium: 140 meq/L (ref 135–145)
Total Bilirubin: 0.7 mg/dL (ref 0.2–1.2)
Total Protein: 7.3 g/dL (ref 6.0–8.3)

## 2024-03-24 LAB — FERRITIN: Ferritin: 37.9 ng/mL (ref 22.0–322.0)

## 2024-03-24 LAB — VITAMIN B12: Vitamin B-12: 308 pg/mL (ref 211–911)

## 2024-03-24 LAB — SEDIMENTATION RATE: Sed Rate: 27 mm/h — ABNORMAL HIGH (ref 0–20)

## 2024-03-24 MED ORDER — HYOSCYAMINE SULFATE 0.125 MG SL SUBL: SUBLINGUAL_TABLET | SUBLINGUAL | 1 refills | Status: DC

## 2024-03-24 MED ORDER — PREDNISONE 10 MG PO TABS: ORAL_TABLET | ORAL | 0 refills | Status: AC

## 2024-03-24 NOTE — Patient Instructions (Signed)
 Your provider has requested that you go to the basement level for lab work before leaving today. Press "B" on the elevator. The lab is located at the first door on the left as you exit the elevator.  Due to recent changes in healthcare laws, you may see the results of your imaging and laboratory studies on MyChart before your provider has had a chance to review them.  We understand that in some cases there may be results that are confusing or concerning to you. Not all laboratory results come back in the same time frame and the provider may be waiting for multiple results in order to interpret others.  Please give [place] 48 hours in order for your provider to thoroughly review all the results before contacting the office for clarification of your results.   We have sent the following medications to your pharmacy for you to pick up at your convenience: Prednisone, levsin  We set you up for an echocardiogram at [facility] , go in entrance C. No prepping required.  I appreciate the opportunity to care for you. Stan Head, MD, [facility]

## 2024-03-24 NOTE — Progress Notes (Signed)
 Raymond Rubio ZOXWRUEAV 89 y.o. 1935-09-16 [id number]  Assessment & Plan:   Encounter Diagnoses  Name Primary?   Crohn's disease of small intestine with other complication (HCC) Yes   Iron deficiency anemia due to chronic blood loss    Heart murmur, systolic    Fatigue, unspecified type    Vitamin D deficiency    I did review the option of pursuing different chronic therapy such as Biologics and he still prefers to continue episodic prednisone.  He tapers 40-30 to 20 to 10 mg and then off about 4 days on each dose.  He is aware of potential long-term impact of prednisone like osteoporosis and joint complications and infections   He has a new heart murmur that might be mitral regurgitation.  He seems asymptomatic.  Echocardiogram ordered.  I explained he may need cardiology follow-up.  Ideally he should have a PCP which he does not.  Regarding parenteral iron therapy I told him I have limited options and we might need to consider hematology referral.  Perhaps he could be infused at [facility] if needed.  He prefers not to have a hematology consultation.  Orders Placed This Encounter  Procedures   CBC with Differential/Platelet   Comprehensive metabolic panel with GFR   Sedimentation rate   Vitamin B12   Ferritin   Vitamin D (25 hydroxy)   ECHOCARDIOGRAM COMPLETE     Subjective:   Chief Complaint:  HPI 89 year old man with a history of Crohn's disease of the small bowel, B12 deficiency, iron deficiency anemia, and vitamin D deficiency here for follow-up last visit October 2024.  The patient has elected to use intermittent prednisone tapers of about 2 weeks duration up to several times a year to treat his signs and symptoms of Crohn's disease that manifest mainly with diarrhea.  No rectal bleeding.  Some abdominal cramping.  He also uses Levsin.  He is asking for refills of both.  He is currently on a taper again at 30 mg after 4 days of 40 mg and he is responding.  Fatigue is an  issue.  He denies any shortness of breath dyspnea on exertion or orthopnea.  He has some chronic pedal edema left greater than right and ankle pretibial areas involved also.    He reports that the first infusion he had was a two-day procedure at [facility], and the second was a one-day procedure at a different facility, which he found less professional.  He would prefer not to return there.  Despite not feeling different after the second infusion, his hemoglobin and iron levels improved. He is on a daily dose of 5,000 units of vitamin D following a previous regimen of 50,000 units weekly for six weeks.  He experiences swelling in his left leg, which has been present for a long time. No shortness of breath with exertion, such as walking up stairs or taking his dog out on a steep lot, but he does report fatigue.  He mentions having spinal stenosis, which contributes to his pain levels. 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 He reports no decrease in hearing since its discovery.   Social history pertinent that his wife had a stroke since last visit and though she is at home and independent she is having some expressive aphasia issues.  This has been understandably concerning to the patient and his wife. Lab Results  Component Value Date  FERRITIN 104.5 10/17/2023      Latest Ref Rng & Units 10/17/2023   12:20 PM 08/09/2023   11:02 AM 03/23/2022   11:34 AM  CBC  WBC 4.0 - 10.5 K/uL 7.5  5.6  6.9   Hemoglobin 13.0 - 17.0 g/dL 09.8  11.9  14.7   Hematocrit 39.0 - 52.0 % 37.9  38.7  37.8   Platelets 150.0 - 400.0 K/uL 425.0  347.0  400.0     Lab Results  Component Value Date   ESRSEDRATE 23 (H) 10/17/2023   Last vitamin D Lab Results  Component Value Date   VD25OH 39.71 10/17/2023       CT enterography in April 2023 IMPRESSION: Multiple chronic strictures involving the terminal ileum and  distal ileum, with moderate distal small bowel dilatation consistent with low-grade,partial obstruction. No imaging signs of active small bowel inflammation or penetrating disease.   Moderate hiatal hernia.   Colonic diverticulosis, without radiographic evidence of diverticulitis.   Stable markedly enlarged prostate gland. Multiple small bladder calculi.   Aortic Atherosclerosis (ICD10-I70.0).  Allergies  Allergen Reactions   Ketamine Other (See Comments)    Causes hypotension and psychotic recovery    Current Meds  Medication Sig   alfuzosin (UROXATRAL) 10 MG 24 hr tablet Take 10 mg by mouth every evening.    aspirin 81 MG tablet Take 81 mg by mouth daily.   B Complex CAPS Take 1 capsule by mouth daily.   Biotin 5000 MCG CAPS Take 1 capsule by mouth daily.   Calcium Carb-Cholecalciferol 600-800 MG-UNIT TABS Take 1 tablet by mouth daily.   Cholecalciferol (VITAMIN D3) 5000 UNITS CAPS Take 1 capsule by mouth daily.   dutasteride (AVODART) 0.5 MG capsule Take 0.5 mg by mouth every evening.    loperamide (IMODIUM) 2 MG capsule Take 1 mg by mouth as needed for diarrhea or loose stools.   loratadine (CLARITIN) 10 MG tablet Take 10 mg by mouth daily as needed for allergies.   Multiple Vitamins-Minerals (CENTRUM ADULTS PO) Take 1 tablet by mouth daily.    vitamin E 400 UNIT capsule Take 400 Units by mouth daily.   Zinc 50 MG TABS Take 1 tablet by mouth daily.   hyoscyamine (LEVSIN SL) 0.125 MG SL tablet DISSOLVE 1 TABLET BY MOUTH 4 TIMES A DAY AS NEEDED   predniSONE (DELTASONE) 10 MG tablet TAKE 4 TABLETS (40MG ) DAILY WHEN FLARING. TAPER AS ADVISED BY MD       Past Medical History:  Diagnosis Date   Arthritis    Benign schwannoma    per pt lumbar region   Bladder calculi    BPH with elevated PSA    Chronic diarrhea    crohn's   Complication of anesthesia    per pt surgery aborted in 1990's when doing ankle block, when given ketamine causes hypotension and psychotic recovery     Crohn's disease of small intestine (HCC) followed by dr Leone Payor   per pt dx 1972   ED (erectile dysfunction)    Enteric hyperoxaluria 09/27/2018   Erectile dysfunction due to arterial insufficiency    Fibromyalgia    History of bladder stone    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   superior sagittal sinus thrombosis /  right ICH  posterior parietal hematoma /  high left frontal ischemic infarct   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---  thrombosis resolved   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  History of seizure 12/ 05/ 2005   x2 in setting of right ICH and Left frontal ischemic infarct   Hypercholesterolemia    Hypogonadism, testicular    Iron deficiency anemia due to chronic blood loss    followed by dr Leone Payor--  treatment IV Iron-- last infusion 10/ 2017   Left acoustic neuroma (HCC)    Meckel's diverticulum    small instestine   Spinal stenosis    Past Surgical History:  Procedure Laterality Date   CYSTO/  BLADDER STONE EXTRACTION  2006  ?, 2019   CYSTOSCOPY WITH LITHOLAPAXY N/A 04/30/2017   Procedure: CYSTOSCOPY WITH LITHOLAPAXY;  Surgeon: Marcine Matar, MD;  Location: [facility];  Service: Urology;  Laterality: N/A;   HIP FRACTURE SURGERY Right 05/11/2004   femoral pinning   HOLMIUM LASER APPLICATION N/A 04/30/2017   Procedure: HOLMIUM LASER APPLICATION;  Surgeon: Marcine Matar, MD;  Location: [facility];  Service: Urology;  Laterality: N/A;   TONSILLECTOMY AND ADENOIDECTOMY  child   Social History   Social History Narrative   Patient is married and retired   Former smoker no alcohol tobacco or drug use at this time   family history includes Diverticulitis in his brother; Kidney Stones in his brother.   Review of Systems As per HPI  Objective:   Physical Exam @BP  (!) 142/64   Pulse 65   Ht 5\' 11"  (1.803 m)   Wt 160 lb  (72.6 kg)   BMI 22.32 kg/m @  General:  NAD Eyes:   anicteric Lungs:  clear Heart::  S1S2 3/6 SEM throughout precordium radiating into axilla + mild JVD ? gallop Abdomen:  soft and nontender, BS+ Ext:   L > R pretibial and ankle edema (he says chronic) edema, cyanosis or clubbing    Data Reviewed:  As per HPI

## 2024-03-25 ENCOUNTER — Encounter: Payer: Self-pay | Admitting: Internal Medicine

## 2024-03-27 ENCOUNTER — Ambulatory Visit (HOSPITAL_COMMUNITY)
Admission: RE | Admit: 2024-03-27 | Discharge: 2024-03-27 | Disposition: A | Discharge status: Home / Self Care | Source: Ambulatory Visit | Attending: Internal Medicine | Admitting: Internal Medicine

## 2024-03-27 DIAGNOSIS — R011 Cardiac murmur, unspecified: Secondary | ICD-10-CM | POA: Diagnosis not present

## 2024-03-27 DIAGNOSIS — I350 Nonrheumatic aortic (valve) stenosis: Secondary | ICD-10-CM | POA: Insufficient documentation

## 2024-03-27 LAB — ECHOCARDIOGRAM COMPLETE
AR max vel: 1.97 cm2
AV Area VTI: 1.42 cm2
AV Area mean vel: 1.92 cm2
AV Mean grad: 10.8 mmHg
AV Peak grad: 9.9 mmHg
Ao pk vel: 1.57 m/s
Area-P 1/2: 2.87 cm2
S' Lateral: 2.7 cm

## 2024-03-28 ENCOUNTER — Encounter: Payer: Self-pay | Admitting: Internal Medicine

## 2024-03-31 ENCOUNTER — Other Ambulatory Visit: Payer: Self-pay | Admitting: Internal Medicine

## 2024-03-31 DIAGNOSIS — I350 Nonrheumatic aortic (valve) stenosis: Secondary | ICD-10-CM

## 2024-04-01 ENCOUNTER — Telehealth: Payer: Self-pay | Admitting: Internal Medicine

## 2024-04-01 NOTE — Telephone Encounter (Signed)
 Requesting f/u call to discuss results. Please advise.

## 2024-04-01 NOTE — Telephone Encounter (Signed)
See alternate results note.  

## 2024-04-03 ENCOUNTER — Telehealth: Payer: Self-pay

## 2024-04-03 NOTE — Telephone Encounter (Signed)
 Added pt to waitlist as high priority and will work towards getting the pt an appt.

## 2024-04-03 NOTE — Telephone Encounter (Signed)
 Need to make an appt for the pt per Dr Tenny Craw.

## 2024-04-03 NOTE — Telephone Encounter (Signed)
-----   Message from Dietrich Pates sent at 04/02/2024  4:12 PM EDT ----- Regarding: FW: AS patient This msg is for Mohid Furuya   Please set pt up to see me for AS. ----- Message ----- From: Iva Boop, MD Sent: 04/02/2024  11:26 AM EDT To: Pricilla Riffle, MD Subject: AS patient                                     Thanks for seeing him, Gunnar Fusi.  Baldo Ash

## 2024-04-04 NOTE — Telephone Encounter (Signed)
 PJ have you heard from the cardiology office about the referral to Dr Tenny Craw?

## 2024-04-14 NOTE — Telephone Encounter (Signed)
 Left the pt a message to se if he would like to move up his August appt with Dr Avanell Bob to 04/15/24 at 8:40 am..I am holding the appt time for him.

## 2024-04-14 NOTE — Telephone Encounter (Signed)
 Patient is scheduled for 04/15/24 at 8:40 AM with Dr. Avanell Bob.

## 2024-04-15 ENCOUNTER — Encounter: Payer: Self-pay | Admitting: Internal Medicine

## 2024-04-15 ENCOUNTER — Ambulatory Visit: Attending: Internal Medicine | Admitting: Internal Medicine

## 2024-04-15 VITALS — BP 130/84 | HR 74 | Ht 70.0 in | Wt 164.2 lb

## 2024-04-15 DIAGNOSIS — Z8679 Personal history of other diseases of the circulatory system: Secondary | ICD-10-CM | POA: Insufficient documentation

## 2024-04-15 DIAGNOSIS — Z13220 Encounter for screening for lipoid disorders: Secondary | ICD-10-CM | POA: Insufficient documentation

## 2024-04-15 DIAGNOSIS — Z131 Encounter for screening for diabetes mellitus: Secondary | ICD-10-CM | POA: Insufficient documentation

## 2024-04-15 DIAGNOSIS — R011 Cardiac murmur, unspecified: Secondary | ICD-10-CM | POA: Diagnosis not present

## 2024-04-15 NOTE — Progress Notes (Signed)
 Cardiology Office Note   Date:  04/15/2024   ID:  Raymond Rubio, DOB 1935/05/21, MRN [id number]  PCP:  Patient, No Pcp Per  Cardiologist:   Ola Berger, MD       History of Present Illness: Raymond Rubio is a 89 y.o. male with a history of Crohn's dz   He is followed by Clements Toro Coombs in clinic   Seen recently   Found to have a mumur on exam Echo done on 03/27/24 showed LVEF normal   MIld aortic stenosis  The pt notes some fatigue  He has attributed that to his Crohn's dz.    He says he is able to work in yard without a problem   He denies CP  No SOB   No dizziness    He has had a CT of abdomen in early April 2025  This shows calcifications of LAD   PT has significant sleeping problems  Chronic   He says started after taking Tramadol   Sleeps about 4 hours per night  Current Meds  Medication Sig   alfuzosin (UROXATRAL) 10 MG 24 hr tablet Take 10 mg by mouth every evening.    aspirin 81 MG tablet Take 81 mg by mouth daily.   B Complex CAPS Take 1 capsule by mouth daily.   Biotin 5000 MCG CAPS Take 1 capsule by mouth daily.   Calcium  Carb-Cholecalciferol 600-800 MG-UNIT TABS Take 1 tablet by mouth daily.   dutasteride (AVODART) 0.5 MG capsule Take 0.5 mg by mouth every evening.    hyoscyamine  (LEVSIN SL) 0.125 MG SL tablet DISSOLVE 1 TABLET BY MOUTH 4 TIMES A DAY AS NEEDED   loperamide (IMODIUM) 2 MG capsule Take 1 mg by mouth as needed for diarrhea or loose stools.   Multiple Vitamins-Minerals (CENTRUM ADULTS PO) Take 1 tablet by mouth daily.    predniSONE  (DELTASONE ) 10 MG tablet TAKE 4 TABLETS (40MG ) DAILY WHEN FLARING. TAPER AS ADVISED BY MD   vitamin E 400 UNIT capsule Take 400 Units by mouth daily.   Zinc 50 MG TABS Take 1 tablet by mouth daily.     Allergies:   Ketamine   Past Medical History:  Diagnosis Date   Arthritis    Benign schwannoma    per pt lumbar region   Bladder calculi    BPH with elevated PSA    Chronic diarrhea    crohn's   Complication  of anesthesia    per pt surgery aborted in 1990's when doing ankle block, when given ketamine causes hypotension and psychotic recovery    Crohn's disease of small intestine (HCC) followed by dr Willy Harvest   per pt dx 1972   ED (erectile dysfunction)    Enteric hyperoxaluria 09/27/2018   Erectile dysfunction due to arterial insufficiency    Fibromyalgia    History of bladder stone    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   superior sagittal sinus thrombosis /  right ICH  posterior parietal hematoma /  high left frontal ischemic infarct   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---  thrombosis resolved   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    History of seizure 12/ 05/ 2005   x2 in setting of right ICH and Left frontal ischemic infarct   Hypercholesterolemia    Hypogonadism, testicular    Iron deficiency anemia due to chronic blood loss    followed by dr Willy Harvest--  treatment IV Iron-- last infusion  10/ 2017   Left acoustic neuroma (HCC)    Meckel's diverticulum    small instestine   Spinal stenosis     Past Surgical History:  Procedure Laterality Date   CYSTO/  BLADDER STONE EXTRACTION  2006  ?, 2019   CYSTOSCOPY WITH LITHOLAPAXY N/A 04/30/2017   Procedure: CYSTOSCOPY WITH LITHOLAPAXY;  Surgeon: Trent Frizzle, MD;  Location: [facility];  Service: Urology;  Laterality: N/A;   HIP FRACTURE SURGERY Right 05/11/2004   femoral pinning   HOLMIUM LASER APPLICATION N/A 04/30/2017   Procedure: HOLMIUM LASER APPLICATION;  Surgeon: Trent Frizzle, MD;  Location: [facility];  Service: Urology;  Laterality: N/A;   TONSILLECTOMY AND ADENOIDECTOMY  child     Social History:  The patient  reports that he quit smoking about 65 years ago. His smoking use included cigarettes. He has never used smokeless tobacco. He reports that he does not drink alcohol and does not use  drugs.   Family History:  The patient's family history includes Diverticulitis in his brother; Kidney Stones in his brother.    ROS:  Please see the history of present illness. All other systems are reviewed and  Negative to the above problem except as noted.    PHYSICAL EXAM: VS:  BP 130/84   Pulse 74   Ht 5\' 10"  (1.778 m)   Wt 164 lb 3.2 oz (74.5 kg)   SpO2 97%   BMI 23.56 kg/m   GEN: Well nourished, well developed, in no acute distress  HEENT: normal  Neck: no JVD, carotid bruits Cardiac: RRR; III/VI earlier peaking systolic murmur LSB to base  No LE  edema  Respiratory:  clear to auscultation  GI: soft, nontender, no masses No hepatomegaly  MS: no deformity Moving all extremities    EKG:  EKG is ordered today.NSR  74 bpm   Echo   2025   1. Left ventricular ejection fraction, by estimation, is 60 to 65%. Left ventricular ejection fraction by 3D volume is 63 %. The left ventricle has  normal function. The left ventricle has no regional wall motion  abnormalities. Left ventricular diastolic   parameters are consistent with Grade I diastolic dysfunction (impaired  relaxation). The average left ventricular global longitudinal strain is  -17.6 %. The global longitudinal strain is abnormal.   2. Right ventricular systolic function is normal. The right ventricular  size is normal. Tricuspid regurgitation signal is inadequate for assessing  PA pressure.   3. The mitral valve is normal in structure. No evidence of mitral valve  regurgitation. No evidence of mitral stenosis.   4. The aortic valve is tricuspid. There is moderate calcification of the  aortic valve. Aortic valve regurgitation is trivial. Mild to moderate  aortic valve stenosis. Aortic valve area, by VTI measures 1.42 cm. Aortic  valve mean gradient measures 10.8  mmHg.   5. Aortic dilatation noted. There is mild dilatation of the aortic root,  measuring 41 mm.   6. The inferior vena cava is normal in size with  greater than 50%  respiratory variability, suggesting right atrial pressure of 3 mmHg.   Lipid Panel No results found for: "CHOL", "TRIG", "HDL", "CHOLHDL", "VLDL", "LDLCALC", "LDLDIRECT"    Wt Readings from Last 3 Encounters:  04/15/24 164 lb 3.2 oz (74.5 kg)  03/24/24 160 lb (72.6 kg)  10/17/23 162 lb (73.5 kg)      ASSESSMENT AND PLAN:  1  Aortic stenosis    I have reviewed echo  images with patient    Overall AS is mild    I am not convinced that he will ever need an intervention WOuld recomm follow up echo in 1 year to assess for progression  2   CAD   CT scan in 03/2024 showed calcifications along LAD   Pt is without symptoms   I do not think fatigue is an anginal equvalent   He can work in yard without problem  3  LIpids  Will get lipomed panel  4  Metabolics  Will check A1C     Follow up in 1 year after echo      Current medicines are reviewed at length with the patient today.  The patient does not have concerns regarding medicines.  Signed, Ola Berger, MD  04/15/2024 8:43 AM    [facility] [address] [place], [place], [place]  [address] Phone: [phone]; Fax: [phone]

## 2024-04-15 NOTE — Patient Instructions (Signed)
 Medication Instructions:   *If you need a refill on your cardiac medications before your next appointment, please call your pharmacy*  Lab Work: NMR, HEPATIC, LIPO A, HGBA1C If you have labs (blood work) drawn today and your tests are completely normal, you will receive your results only by: MyChart Message (if you have MyChart) OR A paper copy in the mail If you have any lab test that is abnormal or we need to change your treatment, we will call you to review the results.  Testing/Procedures: MAY OR JUNE 2026 Your physician has requested that you have an echocardiogram. Echocardiography is a painless test that uses sound waves to create images of your heart. It provides your doctor with information about the size and shape of your heart and how well your heart's chambers and valves are working. This procedure takes approximately one hour. There are no restrictions for this procedure. Please do NOT wear cologne, perfume, aftershave, or lotions (deodorant is allowed). Please arrive 15 minutes prior to your appointment time.  Please note: We ask at that you not bring children with you during ultrasound (echo/ vascular) testing. Due to room size and safety concerns, children are not allowed in the ultrasound rooms during exams. Our front office staff cannot provide observation of children in our lobby area while testing is being conducted. An adult accompanying a patient to their appointment will only be allowed in the ultrasound room at the discretion of the ultrasound technician under special circumstances. We apologize for any inconvenience.   Follow-Up: At [facility], you and your health needs are our priority.  As part of our continuing mission to provide you with exceptional heart care, our providers are all part of one team.  This team includes your primary Cardiologist (physician) and Advanced Practice Providers or APPs (Physician Assistants and Nurse Practitioners) who all work  together to provide you with the care you need, when you need it.  Your next appointment:  MAY/ JUNE 2026 AFTER THE ECHO   We recommend signing up for the patient portal called "MyChart".  Sign up information is provided on this After Visit Summary.  MyChart is used to connect with patients for Virtual Visits (Telemedicine).  Patients are able to view lab/test results, encounter notes, upcoming appointments, etc.  Non-urgent messages can be sent to your provider as well.   To learn more about what you can do with MyChart, go to [email or web address].   Other Instructions       1st Floor: - Lobby - Registration  - Pharmacy  - Lab - Cafe  2nd Floor: - PV Lab - Diagnostic Testing (echo, CT, nuclear med)  3rd Floor: - Vacant  4th Floor: - TCTS (cardiothoracic surgery) - AFib Clinic - Structural Heart Clinic - Vascular Surgery  - Vascular Ultrasound  5th Floor: - HeartCare Cardiology (general and EP) - Clinical Pharmacy for coumadin, hypertension, lipid, weight-loss medications, and med management appointments    Valet parking services will be available as well.

## 2024-04-16 ENCOUNTER — Other Ambulatory Visit: Payer: Self-pay | Admitting: Internal Medicine

## 2024-04-16 LAB — HEPATIC FUNCTION PANEL
ALT: 17 IU/L (ref 0–44)
AST: 17 IU/L (ref 0–40)
Albumin: 4 g/dL (ref 3.7–4.7)
Alkaline Phosphatase: 81 IU/L (ref 44–121)
Bilirubin Total: 0.4 mg/dL (ref 0.0–1.2)
Bilirubin, Direct: 0.14 mg/dL (ref 0.00–0.40)
Total Protein: 6.3 g/dL (ref 6.0–8.5)

## 2024-04-16 LAB — NMR, LIPOPROFILE
Cholesterol, Total: 226 mg/dL — ABNORMAL HIGH (ref 100–199)
HDL Particle Number: 43.9 umol/L (ref >=30.5)
HDL-C: 74 mg/dL (ref >39)
LDL Particle Number: 1175 nmol/L — ABNORMAL HIGH (ref <1000)
LDL Size: 21.4 nm (ref >20.5)
LDL-C (NIH Calc): 114 mg/dL — ABNORMAL HIGH (ref 0–99)
LP-IR Score: 37 (ref <=45)
Small LDL Particle Number: 258 nmol/L (ref <=527)
Triglycerides: 222 mg/dL — ABNORMAL HIGH (ref 0–149)

## 2024-04-16 LAB — HEMOGLOBIN A1C
Est. average glucose Bld gHb Est-mCnc: 117 mg/dL
Hgb A1c MFr Bld: 5.7 % — ABNORMAL HIGH (ref 4.8–5.6)

## 2024-04-16 LAB — LIPOPROTEIN A (LPA): Lipoprotein (a): 29.2 nmol/L (ref <75.0)

## 2024-04-16 NOTE — Telephone Encounter (Signed)
Okay to refill Sir? 

## 2024-04-17 ENCOUNTER — Other Ambulatory Visit: Payer: Self-pay

## 2024-04-17 DIAGNOSIS — Z13220 Encounter for screening for lipoid disorders: Secondary | ICD-10-CM

## 2024-04-17 DIAGNOSIS — Z79899 Other long term (current) drug therapy: Secondary | ICD-10-CM

## 2024-04-17 MED ORDER — ROSUVASTATIN CALCIUM 5 MG PO TABS: 5.0000 mg | ORAL_TABLET | Freq: Every day | ORAL | 3 refills | Status: AC

## 2024-07-04 ENCOUNTER — Encounter: Payer: Self-pay | Admitting: Internal Medicine

## 2024-07-05 NOTE — Telephone Encounter (Signed)
 Agree   It may not be the cause for AV calcifications but I would get Ca from food

## 2024-08-07 ENCOUNTER — Ambulatory Visit: Admitting: Internal Medicine

## 2024-10-14 ENCOUNTER — Other Ambulatory Visit: Payer: Self-pay

## 2024-10-14 MED ORDER — HYOSCYAMINE SULFATE 0.125 MG SL SUBL: SUBLINGUAL_TABLET | SUBLINGUAL | 1 refills | Status: DC

## 2024-10-14 NOTE — Telephone Encounter (Signed)
 Hyoscyamine  refilled as [facility] requested. Raymond Rubio is up to date on his office visits.

## 2024-11-03 ENCOUNTER — Other Ambulatory Visit: Payer: Self-pay | Admitting: Internal Medicine

## 2024-11-28 ENCOUNTER — Telehealth: Payer: Self-pay

## 2024-11-28 ENCOUNTER — Other Ambulatory Visit: Payer: Self-pay | Admitting: Internal Medicine

## 2024-11-28 NOTE — Telephone Encounter (Signed)
 Received a call from pt. He reports being very frustrated because a refill request for Hyoscyamine  was refused on 11/03/24. I explained to the pt that we had just sent a 90 day refill in on 10/21 and it had 1 refill. I tried to explain to the pt that because he had refills available, we did not send in more refills. Pt then stated that [facility] was trying to charge him $50 so there is no way the generic version was sent in. I explained to the pt that we did send the Rx for the generic version and that he needed to call [facility] so they could explain the charge to him. Pt agrees to call them, but states, I still don't understand why you would refuse my prescription. I again explained to the pt the reason for denying it. Pt states he will call [facility], and then hung up the phone.

## 2024-12-01 NOTE — Telephone Encounter (Signed)
 Error
